# Patient Record
Sex: Female | Born: 1940 | Race: Black or African American | Hispanic: No | Smoking: Never smoker
Health system: Southern US, Community
[De-identification: ages and names within clinical notes are randomized; demographics above are authoritative.]

## PROBLEM LIST (undated history)

## (undated) DIAGNOSIS — R1310 Dysphagia, unspecified: Secondary | ICD-10-CM

## (undated) DIAGNOSIS — F0390 Unspecified dementia without behavioral disturbance: Secondary | ICD-10-CM

## (undated) DIAGNOSIS — F329 Major depressive disorder, single episode, unspecified: Secondary | ICD-10-CM

## (undated) DIAGNOSIS — K5900 Constipation, unspecified: Secondary | ICD-10-CM

## (undated) DIAGNOSIS — D649 Anemia, unspecified: Secondary | ICD-10-CM

## (undated) DIAGNOSIS — I639 Cerebral infarction, unspecified: Secondary | ICD-10-CM

## (undated) DIAGNOSIS — H409 Unspecified glaucoma: Secondary | ICD-10-CM

## (undated) DIAGNOSIS — H269 Unspecified cataract: Secondary | ICD-10-CM

## (undated) DIAGNOSIS — E119 Type 2 diabetes mellitus without complications: Secondary | ICD-10-CM

## (undated) DIAGNOSIS — I10 Essential (primary) hypertension: Secondary | ICD-10-CM

## (undated) DIAGNOSIS — E785 Hyperlipidemia, unspecified: Secondary | ICD-10-CM

## (undated) DIAGNOSIS — F32A Depression, unspecified: Secondary | ICD-10-CM

## (undated) HISTORY — DX: Type 2 diabetes mellitus without complications: E11.9

## (undated) HISTORY — DX: Anemia, unspecified: D64.9

## (undated) HISTORY — DX: Hyperlipidemia, unspecified: E78.5

## (undated) HISTORY — DX: Depression, unspecified: F32.A

## (undated) HISTORY — DX: Constipation, unspecified: K59.00

## (undated) HISTORY — DX: Essential (primary) hypertension: I10

## (undated) HISTORY — DX: Dysphagia, unspecified: R13.10

## (undated) HISTORY — DX: Unspecified glaucoma: H40.9

## (undated) HISTORY — DX: Major depressive disorder, single episode, unspecified: F32.9

## (undated) HISTORY — DX: Unspecified cataract: H26.9

## (undated) HISTORY — DX: Unspecified dementia, unspecified severity, without behavioral disturbance, psychotic disturbance, mood disturbance, and anxiety: F03.90

## (undated) HISTORY — DX: Cerebral infarction, unspecified: I63.9

---

## 2011-04-09 LAB — HM DIABETES EYE EXAM

## 2012-05-19 ENCOUNTER — Other Ambulatory Visit: Payer: Self-pay | Admitting: *Deleted

## 2012-05-19 MED ORDER — LORAZEPAM 1 MG PO TABS: ORAL_TABLET | ORAL | Status: DC

## 2012-05-27 ENCOUNTER — Ambulatory Visit: Payer: Medicare Other | Admitting: Neurology

## 2012-06-01 ENCOUNTER — Encounter: Payer: Self-pay | Admitting: Neurology

## 2012-06-01 ENCOUNTER — Ambulatory Visit (INDEPENDENT_AMBULATORY_CARE_PROVIDER_SITE_OTHER): Payer: Medicare Other | Admitting: Neurology

## 2012-06-01 VITALS — BP 168/97 | HR 74

## 2012-06-01 DIAGNOSIS — F329 Major depressive disorder, single episode, unspecified: Secondary | ICD-10-CM

## 2012-06-01 DIAGNOSIS — D649 Anemia, unspecified: Secondary | ICD-10-CM

## 2012-06-01 DIAGNOSIS — F0390 Unspecified dementia without behavioral disturbance: Secondary | ICD-10-CM | POA: Insufficient documentation

## 2012-06-01 DIAGNOSIS — I639 Cerebral infarction, unspecified: Secondary | ICD-10-CM

## 2012-06-01 DIAGNOSIS — R1310 Dysphagia, unspecified: Secondary | ICD-10-CM

## 2012-06-01 DIAGNOSIS — I6350 Cerebral infarction due to unspecified occlusion or stenosis of unspecified cerebral artery: Secondary | ICD-10-CM

## 2012-06-01 DIAGNOSIS — F3289 Other specified depressive episodes: Secondary | ICD-10-CM

## 2012-06-01 DIAGNOSIS — E119 Type 2 diabetes mellitus without complications: Secondary | ICD-10-CM

## 2012-06-01 DIAGNOSIS — E785 Hyperlipidemia, unspecified: Secondary | ICD-10-CM | POA: Insufficient documentation

## 2012-06-01 DIAGNOSIS — H269 Unspecified cataract: Secondary | ICD-10-CM

## 2012-06-01 DIAGNOSIS — H409 Unspecified glaucoma: Secondary | ICD-10-CM | POA: Insufficient documentation

## 2012-06-01 DIAGNOSIS — I10 Essential (primary) hypertension: Secondary | ICD-10-CM

## 2012-06-01 DIAGNOSIS — K5900 Constipation, unspecified: Secondary | ICD-10-CM

## 2012-06-01 NOTE — Progress Notes (Addendum)
Mrs. Artiga is a 72 years old right-handed African American female, accompanied by her daughter, referred by her primary care physician for evaluation of visual loss  She had a past medical history of stroke in 2006, was mute with spastic right hemiparesis, she used to live at home, now in nursing home for one year, at baseline, she need help feeding, dressing, wearing diaper, cannot communicate, only following simple commands, cannot talk in full sentences,  Reported couple years history of gradual onset visual loss, she reported a history of glaucoma, cataract, recent ophthalmology evaluation, was told she is not a candidate for cataract surgery, there was also increased intraocular pressure,  Her daughter wants to rule out neurological cause such as stroke for her visual loss,   Review of Systems  Out of a complete 14 system review, the patient complains of only the following symptoms, and all other reviewed systems are negative. See above HPI  Constitutional:   N/A Cardiovascular:  N/A Ear/Nose/Throat:  N/A Skin: N/A Eyes: N/A Respiratory: N/A Gastroitestinal: N/A    Hematology/Lymphatic:  N/A Endocrine:  N/A Musculoskeletal:N/A Allergy/Immunology: N/A Neurological: N/A Psychiatric:    N/A.  PHYSICAL EXAMINATOINS:  Generalized: In no acute distress sitting in wheelchair  Neck: Supple, no carotid bruits   Cardiac: Regular rate rhythm  Pulmonary: Clear to auscultation bilaterally  Musculoskeletal: No deformity  Neurological examination  Mentation: mute, following one step command,   Cranial nerve II-XII: Pupils were enlarged, very sluggish reactive, could not visualize fundi, patient is not cooperative.   Right lower face weakness.   hearing was intact to finger rubbing bilaterally. Uvula tongue midline.  head turning and shoulder shrug and were normal and symmetric.Tongue protrusion into cheek strength was normal.  Motor: spastic right hemiparesis. Right arm in fixed  elbow flexion, pronation, wrist flexion, finger flexion, right shoulder adduction, right knee extension, right ankle plantar flexion.  Sensory: not reliable.  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 her visual difficulty is likely due to intrinsic eye disease, her daugther reported a history of cataract, glaucoma, with recent elevated eye pressure, there is no evidence of new stroke, 2. Because of the inconvenience of the imaging study, we decided to hold off further evaluation at this point

## 2012-06-10 ENCOUNTER — Non-Acute Institutional Stay (SKILLED_NURSING_FACILITY): Payer: Medicare Other | Admitting: Internal Medicine

## 2012-06-10 DIAGNOSIS — I10 Essential (primary) hypertension: Secondary | ICD-10-CM

## 2012-06-10 DIAGNOSIS — F0280 Dementia in other diseases classified elsewhere without behavioral disturbance: Secondary | ICD-10-CM

## 2012-06-10 DIAGNOSIS — G309 Alzheimer's disease, unspecified: Secondary | ICD-10-CM

## 2012-06-10 DIAGNOSIS — I6990 Unspecified sequelae of unspecified cerebrovascular disease: Secondary | ICD-10-CM

## 2012-06-10 DIAGNOSIS — K5900 Constipation, unspecified: Secondary | ICD-10-CM

## 2012-06-24 DIAGNOSIS — I6990 Unspecified sequelae of unspecified cerebrovascular disease: Secondary | ICD-10-CM | POA: Insufficient documentation

## 2012-06-24 DIAGNOSIS — I10 Essential (primary) hypertension: Secondary | ICD-10-CM | POA: Insufficient documentation

## 2012-06-24 DIAGNOSIS — G309 Alzheimer's disease, unspecified: Secondary | ICD-10-CM | POA: Insufficient documentation

## 2012-06-24 DIAGNOSIS — K5900 Constipation, unspecified: Secondary | ICD-10-CM | POA: Insufficient documentation

## 2012-06-24 NOTE — Progress Notes (Signed)
Patient ID: Katie House, female   DOB: [birth year], 72 y.o.   MRN: [id number]        PROGRESS NOTE  DATE: 06/10/2012  FACILITY: Nursing Home Location: [facility]  LEVEL OF CARE: SNF (31)  Routine Visit  CHIEF COMPLAINT:  Manage dementia and hypertension.    HISTORY OF PRESENT ILLNESS:  REASSESSMENT OF ONGOING PROBLEM(S):  DEMENTIA: The dementia remains stable and continues to function adequately in the current living environment with supervision.  The patient has had little changes in behavior. No complications noted from the medications presently being used.   HTN: Pt 's HTN remains stable.  Staff denies CP, sob, DOE, pedal edema, headaches, dizziness or visual disturbances.  No complications from the medications currently being used.  Last BP : 141/82.  PAST MEDICAL HISTORY : Reviewed.  No changes.  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   PHYSICAL EXAMINATION  VS:  T 97.8       P 86      RR 18     BP 141/82   POX %     WT (Lb) 128.6  GENERAL: no acute distress, normal body habitus NECK: supple, trachea midline, no neck masses, no thyroid tenderness, no thyromegaly RESPIRATORY: breathing is even & unlabored, BS CTAB CARDIAC: RRR, no murmur,no extra heart sounds, no edema GI: abdomen soft, normal BS, no masses, no tenderness, no hepatomegaly, no splenomegaly PSYCHIATRIC: the patient is alert & oriented to person, affect & behavior appropriate  LABS/RADIOLOGY: 01/2012:  Hemoglobin A1C 6.2.    12/2011:  Hemoglobin 10.3, MCV  83.1, otherwise CBC normal.   CMP normal.   ASSESSMENT/PLAN:  Hypertension.  Well controlled.   Dementia.  Advanced.   History of CVA.  Continue supportive care.  Constipation.  Continue current laxatives.   Seizure disorder.  Stable.   Depression.  Continue Celexa.   CPT CODE: 13086

## 2012-07-14 ENCOUNTER — Non-Acute Institutional Stay (SKILLED_NURSING_FACILITY): Payer: Medicare Other | Admitting: Internal Medicine

## 2012-07-14 DIAGNOSIS — I6990 Unspecified sequelae of unspecified cerebrovascular disease: Secondary | ICD-10-CM

## 2012-07-14 DIAGNOSIS — G309 Alzheimer's disease, unspecified: Secondary | ICD-10-CM

## 2012-07-14 DIAGNOSIS — K5900 Constipation, unspecified: Secondary | ICD-10-CM

## 2012-07-14 DIAGNOSIS — I10 Essential (primary) hypertension: Secondary | ICD-10-CM

## 2012-07-14 DIAGNOSIS — F0280 Dementia in other diseases classified elsewhere without behavioral disturbance: Secondary | ICD-10-CM

## 2012-07-16 NOTE — Progress Notes (Signed)
Patient ID: Katie House, female   DOB: 10-09-1940, 72 y.o.   MRN: [id number]        PROGRESS NOTE  DATE: 07/14/2012  FACILITY: Nursing Home Location: [facility]  LEVEL OF CARE: SNF (31)  Routine Visit  CHIEF COMPLAINT:  Manage dementia and hypertension.    HISTORY OF PRESENT ILLNESS:  REASSESSMENT OF ONGOING PROBLEM(S):  DEMENTIA: The dementia remains stable and continues to function adequately in the current living environment with supervision.  The patient has had little changes in behavior. No complications noted from the medications presently being used.   HTN: Pt 's HTN remains stable.  Staff deniey CP, sob, DOE, pedal edema, headaches, dizziness or visual disturbances.  No complications from the medications currently being used.  Last BP : 143/80, 141/82.  PAST MEDICAL HISTORY : Reviewed.  No changes.  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   PHYSICAL EXAMINATION  VS:  T 98       P 73      RR 20     BP 143/80   POX %     WT (Lb) 125.6  GENERAL: no acute distress, normal body habitus NECK: supple, trachea midline, no neck masses, no thyroid tenderness, no thyromegaly RESPIRATORY: breathing is even & unlabored, BS CTAB CARDIAC: RRR, no murmur,no extra heart sounds, no edema GI: abdomen soft, normal BS, no masses, no tenderness, no hepatomegaly, no splenomegaly PSYCHIATRIC: the patient is alert & disoriented to, affect & behavior appropriate  LABS/RADIOLOGY: 01/2012:  Hemoglobin A1C 6.2.    12/2011:  Hemoglobin 10.3, MCV  83.1, otherwise CBC normal.   CMP normal.   ASSESSMENT/PLAN:  Hypertension.  Well controlled.   Dementia.  Advanced.   History of CVA.  Continue supportive care.  Constipation.  Continue current laxatives.   Seizure disorder.  Stable.   Depression.  Continue Celexa.   Check CBC and CMP.  CPT CODE: 78295

## 2012-07-21 ENCOUNTER — Non-Acute Institutional Stay (SKILLED_NURSING_FACILITY): Payer: Medicare Other | Admitting: Internal Medicine

## 2012-07-21 DIAGNOSIS — D638 Anemia in other chronic diseases classified elsewhere: Secondary | ICD-10-CM

## 2012-07-21 DIAGNOSIS — N179 Acute kidney failure, unspecified: Secondary | ICD-10-CM

## 2012-08-10 NOTE — Progress Notes (Addendum)
Patient ID: Katie House, female   DOB: 10/23/1940, 72 y.o.   MRN: [id number]        PROGRESS NOTE  DATE: 07/21/2012  FACILITY:  [name]  LEVEL OF CARE: SNF (31)  Acute Visit  CHIEF COMPLAINT:  Manage acute renal failure and anemia of chronic disease.    HISTORY OF PRESENT ILLNESS: I was requested by the staff to assess the patient regarding above problem(s):  ACUTE RENAL FAILURE:  On 07/17/2012, patient's BUN was 25, creatinine 1.3.  In 12/2011, BUN 29, creatinine 1.18.  Patient is a poor historian secondary to aphasia.  Staff do not report increasing confusion or increasing lower extremity swelling.    ANEMIA: The anemia has been stable. The staff complications from the medications currently being used.  The patient is currently not on iron.   On 07/17/2012, patient's hemoglobin was 10.3, MCV 83.7.  In 12/2011, hemoglobin 10.3.    PAST MEDICAL HISTORY : Reviewed.  No changes.  CURRENT MEDICATIONS: Reviewed per [facility]  REVIEW OF SYSTEMS:  Unobtainable due to aphasia.    PHYSICAL EXAMINATION  GENERAL: no acute distress, normal body habitus EYES: conjunctivae normal, sclerae normal, normal eye lids NECK: supple, trachea midline, no neck masses, no thyroid tenderness, no thyromegaly LYMPHATICS: no LAN in the neck, no supraclavicular LAN RESPIRATORY: breathing is even & unlabored, BS CTAB CARDIAC: RRR, no murmur,no extra heart sounds, no edema GI: abdomen soft, normal BS, no masses, no tenderness, no hepatomegaly, no splenomegaly PSYCHIATRIC: the patient is alert & oriented to person, affect & behavior appropriate  ASSESSMENT/PLAN:  Acute renal failure.  New problem.  She is currently not on renal toxic medications. Push fluids & reassess renal functions.   Anemia of chronic disease.  Hemoglobin stable.    CPT CODE: 40981

## 2012-08-11 DIAGNOSIS — D638 Anemia in other chronic diseases classified elsewhere: Secondary | ICD-10-CM | POA: Insufficient documentation

## 2012-08-26 ENCOUNTER — Encounter: Payer: Self-pay | Admitting: Adult Health

## 2012-08-26 ENCOUNTER — Non-Acute Institutional Stay (SKILLED_NURSING_FACILITY): Payer: Medicare Other | Admitting: Adult Health

## 2012-08-26 DIAGNOSIS — I6990 Unspecified sequelae of unspecified cerebrovascular disease: Secondary | ICD-10-CM

## 2012-08-26 DIAGNOSIS — F3289 Other specified depressive episodes: Secondary | ICD-10-CM

## 2012-08-26 DIAGNOSIS — I10 Essential (primary) hypertension: Secondary | ICD-10-CM

## 2012-08-26 DIAGNOSIS — F32A Depression, unspecified: Secondary | ICD-10-CM

## 2012-08-26 DIAGNOSIS — R569 Unspecified convulsions: Secondary | ICD-10-CM

## 2012-08-26 DIAGNOSIS — F329 Major depressive disorder, single episode, unspecified: Secondary | ICD-10-CM

## 2012-08-26 DIAGNOSIS — K5900 Constipation, unspecified: Secondary | ICD-10-CM

## 2012-08-26 MED ORDER — HYDROCHLOROTHIAZIDE 12.5 MG PO CAPS: 12.5000 mg | ORAL_CAPSULE | Freq: Every day | ORAL | Status: DC

## 2012-08-26 NOTE — Progress Notes (Signed)
Patient ID: Katie House, female   DOB: [birth year], 72 y.o.   MRN: [id number]  [facility]  Allergies  Allergen Reactions  . Latex      Chief Complaint  Patient presents with  . Medical Managment of Chronic Issues    HPI: She is being seen for the management of her chronic illnesses. There are no concerns being voiced by the nursing staff. Her blood pressure is elevated and will require medication adjustment.   Past Medical History  Diagnosis Date  . Dementia   . Diabetes mellitus   . High blood pressure   . Glaucoma   . Cataracts, both eyes   . Hyperlipemia   . Depression   . Constipation   . Anemia   . Stroke   . Dysphagia, unspecified(787.20)   . Dementia, unspecified, without behavioral disturbance     No past surgical history on file.  VITAL SIGNS BP 159/80  Pulse 78  Ht 5\' 4"  (1.626 m)  Wt 125 lb 12.8 oz (57.063 kg)  BMI 21.58 kg/m2   Patient's Medications  New Prescriptions   No medications on file  Previous Medications   CALCIUM & MAGNESIUM CARBONATES (MYLANTA) 311-232 MG PER TABLET    Take 1 tablet by mouth as needed for heartburn.   CITALOPRAM (CELEXA) 20 MG TABLET    Take 20 mg by mouth daily.   LEVETIRACETAM (KEPPRA) 500 MG TABLET    Take 500 mg by mouth 2 (two) times daily.    LOSARTAN (COZAAR) 100 MG TABLET    Take 100 mg by mouth daily.   POLYETHYLENE GLYCOL POWDER (MIRALAX) POWDER    Take 17 g by mouth daily.   TRAMADOL (ULTRAM) 50 MG TABLET    Take 50 mg by mouth every 6 (six) hours as needed for pain.  Modified Medications   No medications on file  Discontinued Medications   HYDROXYPROPYL METHYLCELLULOSE (ISOPTO TEARS) 2.5 % OPHTHALMIC SOLUTION    1 drop as needed.   LORAZEPAM (ATIVAN) 1 MG TABLET    Take 1 tablet prior to dental procedure    SIGNIFICANT DIAGNOSTIC EXAMS   01-02-12: wbc 7.4; hgb 10.3; hct 31.5; mcv 83.1; plt 192; glucose 97; bun 29; creat 1.18; k+ 4.6 Na++ 141; liver normal albumin 3.5 01-30-12: hgb ac 6.2 07-17-12: wbc  8.4; hgb 10.3; hct 30.8; mcv 83.7; plt 213; glucose 105; bun 25; creat 1.30; k+ 4.3 Na++ 140; liver normal albumin 3.2     Review of Systems  Unable to perform ROS    Physical Exam  Constitutional:  frail  Neck: Neck supple. No JVD present. No thyromegaly present.  Cardiovascular: Normal rate, regular rhythm and intact distal pulses.   Respiratory: Effort normal and breath sounds normal. No respiratory distress.  GI: Soft. Bowel sounds are normal. She exhibits no distension. There is no tenderness.  Musculoskeletal: She exhibits no edema.  Has right sided spastic hemiparesis present   Neurological: She is alert.  Skin: Skin is warm and dry.       ASSESSMENT/ PLAN:  High blood pressure Is elevated will continue cozaar 100 mg daily will add hctz 12.5 mg daily will have nursing check blood pressure twice daily for 2 weeks and report will check bmp in 2 weeks and will monitor her status   Seizures No reports of seizure activity; will continue keppra 500 mg twice daily and will monitor her status   Depression Is stable will continue celexa 20 mg daily and will monitor  Unspecified constipation  Is stable will continue miralax daily and will monitor   Unspecified late effects of cerebrovascular disease Is without change in status is currently not on medications; has right sided spastic hemiparesis present

## 2012-08-26 NOTE — Assessment & Plan Note (Signed)
Is stable will continue celexa 20 mg daily and will monitor

## 2012-08-26 NOTE — Assessment & Plan Note (Signed)
Is stable will continue miralax daily and will monitor

## 2012-08-26 NOTE — Assessment & Plan Note (Signed)
No reports of seizure activity; will continue keppra 500 mg twice daily and will monitor her status

## 2012-08-26 NOTE — Assessment & Plan Note (Signed)
Is elevated will continue cozaar 100 mg daily will add hctz 12.5 mg daily will have nursing check blood pressure twice daily for 2 weeks and report will check bmp in 2 weeks and will monitor her status

## 2012-08-26 NOTE — Assessment & Plan Note (Signed)
Is without change in status is currently not on medications; has right sided spastic hemiparesis present

## 2012-09-15 ENCOUNTER — Non-Acute Institutional Stay (SKILLED_NURSING_FACILITY): Payer: Medicare Other | Admitting: Internal Medicine

## 2012-09-15 DIAGNOSIS — I10 Essential (primary) hypertension: Secondary | ICD-10-CM

## 2012-09-15 DIAGNOSIS — I639 Cerebral infarction, unspecified: Secondary | ICD-10-CM

## 2012-09-15 DIAGNOSIS — F0390 Unspecified dementia without behavioral disturbance: Secondary | ICD-10-CM

## 2012-09-15 DIAGNOSIS — I6350 Cerebral infarction due to unspecified occlusion or stenosis of unspecified cerebral artery: Secondary | ICD-10-CM

## 2012-09-15 DIAGNOSIS — N179 Acute kidney failure, unspecified: Secondary | ICD-10-CM

## 2012-09-17 DIAGNOSIS — N179 Acute kidney failure, unspecified: Secondary | ICD-10-CM | POA: Insufficient documentation

## 2012-09-17 NOTE — Progress Notes (Signed)
Patient ID: Katie House, female   DOB: 10-16-1940, 71 y.o.   MRN: [id number]        PROGRESS NOTE  DATE: 09/15/2012  FACILITY: Nursing Home Location: [facility]  LEVEL OF CARE: SNF (31)  Routine Visit  CHIEF COMPLAINT:  Manage acute renal failure, dementia and hypertension.    HISTORY OF PRESENT ILLNESS:  REASSESSMENT OF ONGOING PROBLEM(S):  ARF: 18-1-14 BUN 27, creatinine 1.43, on 07/17/12. BUN 25, creatinine 1.3. Patient was started on hydrochlorothiazide. Staff do not report increasing confusion or lower extremity swelling.  DEMENTIA: The dementia remains stable and continues to function adequately in the current living environment with supervision.  The patient has had little changes in behavior. No complications noted from the medications presently being used. Patient does not follow commands.  HTN: Pt 's HTN remains stable.  Staff deny CP, sob, DOE, pedal edema, headaches, dizziness or visual disturbances.  No complications from the medications currently being used.  Last BP : 143/80, 141/82, 131/71.  PAST MEDICAL HISTORY : Reviewed.  No changes.  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   PHYSICAL EXAMINATION  VS:  T 98.5       P 71      RR 16    BP 131/71  POX %     WT (Lb) 125.8  GENERAL: no acute distress, normal body habitus EYES: Normal conjunctivae, normal sclerae, no discharge NECK: supple, trachea midline, no neck masses, no thyroid tenderness, no thyromegaly LYMPHATICS: No cervical lymphadenopathy, no supraclavicular lymphadenopathy RESPIRATORY: breathing is even & unlabored, BS CTAB CARDIAC: RRR, no murmur,no extra heart sounds, no edema GI: abdomen soft, normal BS, no masses, no tenderness, no hepatomegaly, no splenomegaly PSYCHIATRIC: the patient is alert & disoriented to, affect & behavior appropriate  LABS/RADIOLOGY:  8/14 glucose 110, BUN 27, creatinine 1.43 otherwise BMP  normal 6/14 hemoglobin 10.3, MCV 83.7 otherwise CBC normal , albumin 3.2 otherwise liver profile normal,  01/2012:  Hemoglobin A1C 6.2.    12/2011:  Hemoglobin 10.3, MCV  83.1, otherwise CBC normal.   CMP normal.   ASSESSMENT/PLAN:  Hypertension.  Well controlled.   Acute renal failure-likely secondary to hydrochlorothiazide. Recheck.  Dementia.  Advanced.   History of CVA.  Continue supportive care.  Constipation.  Continue current laxatives.   Seizure disorder.  Stable.   Depression.  Continue Celexa.   CPT CODE: 91478

## 2012-10-21 ENCOUNTER — Non-Acute Institutional Stay (SKILLED_NURSING_FACILITY): Payer: Medicare Other | Admitting: Internal Medicine

## 2012-10-21 DIAGNOSIS — F0280 Dementia in other diseases classified elsewhere without behavioral disturbance: Secondary | ICD-10-CM

## 2012-10-21 DIAGNOSIS — I10 Essential (primary) hypertension: Secondary | ICD-10-CM

## 2012-10-21 DIAGNOSIS — I6350 Cerebral infarction due to unspecified occlusion or stenosis of unspecified cerebral artery: Secondary | ICD-10-CM

## 2012-10-21 DIAGNOSIS — N179 Acute kidney failure, unspecified: Secondary | ICD-10-CM

## 2012-10-21 DIAGNOSIS — I639 Cerebral infarction, unspecified: Secondary | ICD-10-CM

## 2012-10-21 NOTE — Progress Notes (Signed)
Patient ID: Katie House, female   DOB: 02-Feb-1941, 72 y.o.   MRN: [id number]        PROGRESS NOTE  DATE: 10/21/2012  FACILITY: Nursing Home Location: [facility]  LEVEL OF CARE: SNF (31)  Routine Visit  CHIEF COMPLAINT:  Manage acute renal failure, dementia and hypertension.    HISTORY OF PRESENT ILLNESS:  REASSESSMENT OF ONGOING PROBLEM(S):  ARF: 09-11-12 BUN 27, creatinine 1.43, on 07/17/12. BUN 25, creatinine 1.3. Patient was started on hydrochlorothiazide. Staff do not report increasing confusion or lower extremity swelling.  DEMENTIA: The dementia remains stable and continues to function adequately in the current living environment with supervision.  The patient has had little changes in behavior. No complications noted from the medications presently being used. Patient does not follow commands.  HTN: Pt 's HTN remains stable.  Staff deny CP, sob, DOE, pedal edema, headaches, dizziness or visual disturbances.  No complications from the medications currently being used.  Last BP : 143/80, 141/82, 131/71, 156/73.  PAST MEDICAL HISTORY : Reviewed.  No changes.  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   PHYSICAL EXAMINATION  VS:  T 98.2      P 96      RR 20    BP 156/73  POX %     WT (Lb) 123  GENERAL: no acute distress, normal body habitus NECK: supple, trachea midline, no neck masses, no thyroid tenderness, no thyromegaly RESPIRATORY: breathing is even & unlabored, BS CTAB CARDIAC: RRR, no murmur,no extra heart sounds, no edema GI: abdomen soft, normal BS, no masses, no tenderness, no hepatomegaly, no splenomegaly PSYCHIATRIC: the patient is alert & disoriented to, affect & behavior appropriate  LABS/RADIOLOGY:  8/14 glucose 110, BUN 27, creatinine 1.43 otherwise BMP normal 6/14 hemoglobin 10.3, MCV 83.7 otherwise CBC normal , albumin 3.2 otherwise liver profile normal,  01/2012:  Hemoglobin A1C  6.2.    12/2011:  Hemoglobin 10.3, MCV  83.1, otherwise CBC normal.   CMP normal.   ASSESSMENT/PLAN:  Hypertension.  uncontrolled. Will review a log  Acute renal failure-likely secondary to hydrochlorothiazide. Recheck.  Dementia.  Advanced.   History of CVA.  Continue supportive care.  Constipation.  Continue current laxatives.   Seizure disorder.  Stable.   Depression.  Continue Celexa.   CPT CODE: 46962

## 2012-10-27 ENCOUNTER — Non-Acute Institutional Stay (SKILLED_NURSING_FACILITY): Payer: Medicare Other | Admitting: Family

## 2012-10-27 ENCOUNTER — Non-Acute Institutional Stay: Payer: Medicare Other | Admitting: Family

## 2012-10-27 ENCOUNTER — Encounter: Payer: Self-pay | Admitting: Family

## 2012-10-27 DIAGNOSIS — N179 Acute kidney failure, unspecified: Secondary | ICD-10-CM

## 2012-10-27 DIAGNOSIS — I10 Essential (primary) hypertension: Secondary | ICD-10-CM

## 2012-10-27 NOTE — Progress Notes (Signed)
Patient ID: Katie House, female   DOB: [birth year], 72 y.o.   MRN: [id number]  Date:10/27/12  Chief Complaint  Patient presents with  . Acute Visit    Follow up for abnormal labs     HPI:  Pt is being followed for medical management of chronic illnesses.  Pt is also being followed up for abnormal lab values. Pt Creatinine is 1.43.  Pt creatinine level increase has been insidious. Prior lab creatinine lab value 1.43, with otherwise normal BMET.Pt was recently started on HCTZ..  Pt and nursing staff denies constitutional s/s such as N/V/D, fever, chills, anuria, or flank pain.  Allergies  Allergen Reactions  . Latex      Medication List       This list is accurate as of: 10/27/12  9:04 PM.  Always use your most recent med list.               citalopram 20 MG tablet  Commonly known as:  CELEXA  Take 20 mg by mouth daily.     hydrochlorothiazide 12.5 MG capsule  Commonly known as:  MICROZIDE  Take 1 capsule (12.5 mg total) by mouth daily.     KEPPRA 500 MG tablet  Generic drug:  levETIRAcetam  Take 500 mg by mouth 2 (two) times daily.     losartan 100 MG tablet  Commonly known as:  COZAAR  Take 100 mg by mouth daily.     MIRALAX powder  Generic drug:  polyethylene glycol powder  Take 17 g by mouth daily.     MYLANTA 311-232 MG per tablet  Generic drug:  calcium & magnesium carbonates  Take 1 tablet by mouth as needed for heartburn.     traMADol 50 MG tablet  Commonly known as:  ULTRAM  Take 50 mg by mouth every 6 (six) hours as needed for pain.         DATA REVIEWED   Laboratory Studies: 09-11-12 BUN 27, creatinine 1.43, on 07/17/12. BUN 25, creatinine 1.3.  10-27-12 Creatinine 1/43, Calcium 9.3, Glucose 119, CO2 26, Chloride 105, Potassium 4.0, Sodium 139      Past Medical History  Diagnosis Date  . Dementia   . Diabetes mellitus   . High blood pressure   . Glaucoma   . Cataracts, both eyes   . Hyperlipemia   . Depression   . Constipation   . Anemia    . Stroke   . Dysphagia, unspecified(787.20)   . Dementia, unspecified, without behavioral disturbance    History reviewed. No pertinent past surgical history.  History   Social History  . Marital Status: Divorced    Spouse Name: N/A    Number of Children: N/A  . Years of Education: N/A   Occupational History  . Not on file.   Social History Main Topics  . Smoking status: Never Smoker   . Smokeless tobacco: Not on file  . Alcohol Use: No  . Drug Use: No  . Sexual Activity: Not on file   Other Topics Concern  . Not on file   Social History Narrative  . No narrative on file     Review of Systems  Pt unable to fully participate in the ROS  Physical Exam Blood pressure 117/68, pulse 70, temperature 97.1 F (36.2 C), resp. rate 20.  ASSESSMENT/PLAN  Acute Renal Failure-Encourage fluids, recheck BMET in one week. Hypertension-Blood pressure remains stable; will continue current regimen   Follow up: In one week

## 2012-10-27 NOTE — Progress Notes (Signed)
Patient ID: Katie House, female   DOB: 1940/05/09, 72 y.o.   MRN: [id number] Patient ID: Katie House, female   DOB: 1940-12-14, 72 y.o.   MRN: [id number]  Date:10/27/12  Chief Complaint  Patient presents with  . Acute Visit    Follow up for abnormal labs     HPI:  Pt is being followed for medical management of chronic illnesses.  Pt is also being followed up for abnormal lab values. Pt Creatinine is 1.43.  Pt creatinine level increase has been insidious. Prior lab creatinine lab value 1.43, with otherwise normal BMET.Pt was recently started on HCTZ..  Pt and nursing staff denies constitutional s/s such as N/V/D, fever, chills, anuria, or flank pain.  Allergies  Allergen Reactions  . Latex      Medication List       This list is accurate as of: 10/27/12  9:04 PM.  Always use your most recent med list.               citalopram 20 MG tablet  Commonly known as:  CELEXA  Take 20 mg by mouth daily.     hydrochlorothiazide 12.5 MG capsule  Commonly known as:  MICROZIDE  Take 1 capsule (12.5 mg total) by mouth daily.     KEPPRA 500 MG tablet  Generic drug:  levETIRAcetam  Take 500 mg by mouth 2 (two) times daily.     losartan 100 MG tablet  Commonly known as:  COZAAR  Take 100 mg by mouth daily.     MIRALAX powder  Generic drug:  polyethylene glycol powder  Take 17 g by mouth daily.     MYLANTA 311-232 MG per tablet  Generic drug:  calcium & magnesium carbonates  Take 1 tablet by mouth as needed for heartburn.     traMADol 50 MG tablet  Commonly known as:  ULTRAM  Take 50 mg by mouth every 6 (six) hours as needed for pain.         DATA REVIEWED   Laboratory Studies: 09-11-12 BUN 27, creatinine 1.43, on 07/17/12. BUN 25, creatinine 1.3.  10-27-12 Creatinine 1/43, Calcium 9.3, Glucose 119, CO2 26, Chloride 105, Potassium 4.0, Sodium 139      Past Medical History  Diagnosis Date  . Dementia   . Diabetes mellitus   . High blood pressure   . Glaucoma   . Cataracts,  both eyes   . Hyperlipemia   . Depression   . Constipation   . Anemia   . Stroke   . Dysphagia, unspecified(787.20)   . Dementia, unspecified, without behavioral disturbance    History reviewed. No pertinent past surgical history.  History   Social History  . Marital Status: Divorced    Spouse Name: N/A    Number of Children: N/A  . Years of Education: N/A   Occupational History  . Not on file.   Social History Main Topics  . Smoking status: Never Smoker   . Smokeless tobacco: Not on file  . Alcohol Use: No  . Drug Use: No  . Sexual Activity: Not on file   Other Topics Concern  . Not on file   Social History Narrative  . No narrative on file     Review of Systems  Pt unable to fully participate in the ROS  Physical Exam Blood pressure 117/68, pulse 70, temperature 97.1 F (36.2 C), resp. rate 20. Physical Exam  Constitutional: No distress.  HENT:  Head: Normocephalic.  Mouth/Throat: Oropharynx is clear  and moist.  Cardiovascular: Normal rate, regular rhythm and normal heart sounds.   No murmur heard. Pulmonary/Chest: Effort normal and breath sounds normal. No respiratory distress.  Abdominal: Soft. Bowel sounds are normal.  Lymphadenopathy:    She has no cervical adenopathy.  Neurological: She is alert.  Skin: Skin is warm and dry.     ASSESSMENT/PLAN  Acute Renal Failure-Encourage fluids, recheck BMET in one week. Hypertension-Blood pressure remains stable; will continue current regimen   Follow up: In one week

## 2012-11-03 ENCOUNTER — Non-Acute Institutional Stay (SKILLED_NURSING_FACILITY): Payer: Medicare Other | Admitting: Internal Medicine

## 2012-11-03 DIAGNOSIS — N289 Disorder of kidney and ureter, unspecified: Secondary | ICD-10-CM | POA: Insufficient documentation

## 2012-11-03 NOTE — Progress Notes (Signed)
PROGRESS NOTE  DATE: 11/03/2012  FACILITY:  [name]  LEVEL OF CARE: SNF (31)  Acute Visit  CHIEF COMPLAINT:  Manage renal insufficiency  HISTORY OF PRESENT ILLNESS: I was requested by the staff to assess the patient regarding above problem(s):  CHRONIC KIDNEY DISEASE: The patient's chronic kidney disease remains stable.  staff deny increasing lower extremity swelling or confusion. Last BUN and creatinine are: 23/1.43 in 10-23-12. In 8-14 creatinine 1.43.  PAST MEDICAL HISTORY : Reviewed.  No changes.  CURRENT MEDICATIONS: Reviewed per [facility]  PHYSICAL EXAMINATION  GENERAL: no acute distress, normal body habitus RESPIRATORY: breathing is even & unlabored, BS CTAB CARDIAC: RRR, no murmur,no extra heart sounds, no edema  LABS/RADIOLOGY: See history of present illness  ASSESSMENT/PLAN:  Renal insufficiency-renal functions stable.  CPT CODE: 13086

## 2012-11-09 ENCOUNTER — Other Ambulatory Visit: Payer: Self-pay

## 2012-11-09 MED ORDER — LORAZEPAM 1 MG PO TABS: ORAL_TABLET | ORAL | Status: DC

## 2012-11-09 NOTE — Telephone Encounter (Signed)
Verified dose and instructions reflect manual request received by nursing home.   

## 2012-11-17 ENCOUNTER — Non-Acute Institutional Stay (SKILLED_NURSING_FACILITY): Payer: Medicare Other | Admitting: Internal Medicine

## 2012-11-17 DIAGNOSIS — I6990 Unspecified sequelae of unspecified cerebrovascular disease: Secondary | ICD-10-CM

## 2012-11-17 DIAGNOSIS — N189 Chronic kidney disease, unspecified: Secondary | ICD-10-CM

## 2012-11-17 DIAGNOSIS — I150 Renovascular hypertension: Secondary | ICD-10-CM

## 2012-11-17 DIAGNOSIS — F0280 Dementia in other diseases classified elsewhere without behavioral disturbance: Secondary | ICD-10-CM

## 2012-11-27 DIAGNOSIS — N189 Chronic kidney disease, unspecified: Secondary | ICD-10-CM | POA: Insufficient documentation

## 2012-11-27 DIAGNOSIS — I150 Renovascular hypertension: Secondary | ICD-10-CM | POA: Insufficient documentation

## 2012-11-27 NOTE — Progress Notes (Signed)
Patient ID: Katie House, female   DOB: 07/17/1940, 72 y.o.   MRN: [id number]        PROGRESS NOTE  DATE: 11/17/2012  FACILITY: Nursing Home Location: [facility]  LEVEL OF CARE: SNF (31)  Routine Visit  CHIEF COMPLAINT:  Manage CKD, dementia and hypertension.    HISTORY OF PRESENT ILLNESS:  REASSESSMENT OF ONGOING PROBLEM(S):  CHRONIC KIDNEY DISEASE: The patient's chronic kidney disease remains stable.  staff deny increasing lower extremity swelling or confusion. Last BUN and creatinine are: 26/1.45 on 11-05-12.  In 8/14 27/1.45, In 6-14  25/1.3.   DEMENTIA: The dementia remains stable and continues to function adequately in the current living environment with supervision.  The patient has had little changes in behavior. No complications noted from the medications presently being used. Patient does not follow commands.  HTN: Pt 's HTN remains stable.  Staff deny CP, sob, DOE, pedal edema, headaches, dizziness or visual disturbances.  No complications from the medications currently being used.  Last BP : 143/80, 141/82, 131/71, 156/73.  PAST MEDICAL HISTORY : Reviewed.  No changes.  CURRENT MEDICATIONS: Reviewed per [facility]  REVIEW OF SYSTEMS:  Unobtainable due to global aphasia.   PHYSICAL EXAMINATION  VS:  T 98     P 43     RR 18    BP 123/68  POX %     WT (Lb) 123  GENERAL: no acute distress, normal body habitus EYES: unable to assess NECK: supple, trachea midline, no neck masses, no thyroid tenderness, no thyromegaly LYMPHATICS: no cervical LAN, no supraclavicular LAN RESPIRATORY: breathing is even & unlabored, BS CTAB CARDIAC: RRR, no murmur,no extra heart sounds, no edema GI: abdomen soft, normal BS, no masses, no tenderness, no hepatomegaly, no splenomegaly PSYCHIATRIC: the patient is alert & disoriented to, affect & behavior appropriate  LABS/RADIOLOGY:  9-14 glc 115, bun 26, cr 1.45 ow bmp nl  8/14 glucose 110, BUN 27, creatinine 1.43 otherwise  BMP normal 6/14 hemoglobin 10.3, MCV 83.7 otherwise CBC normal , albumin 3.2 otherwise liver profile normal,  01/2012:  Hemoglobin A1C 6.2.    12/2011:  Hemoglobin 10.3, MCV  83.1, otherwise CBC normal.   CMP normal.   ASSESSMENT/PLAN:  Hypertension.  Well controlled.  CKD-stable.  Dementia.  Advanced.   History of CVA.  Continue supportive care.  Constipation.  Continue current laxatives.   Seizure disorder.  Stable.   Depression.  Continue Celexa.   CPT CODE: 40981

## 2013-01-18 ENCOUNTER — Non-Acute Institutional Stay (SKILLED_NURSING_FACILITY): Payer: Medicare Other | Admitting: Internal Medicine

## 2013-01-18 DIAGNOSIS — L84 Corns and callosities: Secondary | ICD-10-CM

## 2013-03-01 ENCOUNTER — Non-Acute Institutional Stay (SKILLED_NURSING_FACILITY): Payer: Medicare Other | Admitting: Internal Medicine

## 2013-03-01 DIAGNOSIS — F0280 Dementia in other diseases classified elsewhere without behavioral disturbance: Secondary | ICD-10-CM

## 2013-03-01 DIAGNOSIS — I6990 Unspecified sequelae of unspecified cerebrovascular disease: Secondary | ICD-10-CM

## 2013-03-01 DIAGNOSIS — G309 Alzheimer's disease, unspecified: Secondary | ICD-10-CM

## 2013-03-01 DIAGNOSIS — N189 Chronic kidney disease, unspecified: Secondary | ICD-10-CM

## 2013-03-01 DIAGNOSIS — I150 Renovascular hypertension: Secondary | ICD-10-CM

## 2013-03-05 NOTE — Progress Notes (Signed)
Patient ID: Katie House, female   DOB: 12/05/1940, 73 y.o.   MRN: [id number]         PROGRESS NOTE  DATE: 03/01/2013  FACILITY: Nursing Home Location: [facility]  LEVEL OF CARE: SNF (31)  Routine Visit  CHIEF COMPLAINT:  Manage CKD, dementia and hypertension.    HISTORY OF PRESENT ILLNESS:  REASSESSMENT OF ONGOING PROBLEM(S):  CHRONIC KIDNEY DISEASE: The patient's chronic kidney disease remains stable.  staff deny increasing lower extremity swelling or confusion. Last BUN and creatinine are: 26/1.45 on 11-05-12.  In 8/14 27/1.45, In 6-14  25/1.3, in 9-14 BUN 26, creatinine 1.45.  DEMENTIA: The dementia remains stable and continues to function adequately in the current living environment with supervision.  The patient has had little changes in behavior. No complications noted from the medications presently being used. Patient does not follow commands.  HTN: Pt 's HTN remains stable.  Staff deny CP, sob, DOE, pedal edema, headaches, dizziness or visual disturbances.  No complications from the medications currently being used.  Last BP : 143/80, 141/82, 131/71, 156/73, 130/70.  PAST MEDICAL HISTORY : Reviewed.  No changes.  [facility]  REVIEW OF SYSTEMS:  Unobtainable due to global aphasia.   PHYSICAL EXAMINATION  VS:  T 97     P 64     RR 18    BP 130/70  POX %     WT (Lb) 121  GENERAL: no acute distress, normal body habitus SKIN: Dry skin on feet EYES: unable to assess NECK: supple, trachea midline, no neck masses, no thyroid tenderness, no thyromegaly LYMPHATICS: no cervical LAN, no supraclavicular LAN RESPIRATORY: breathing is even & unlabored, BS CTAB CARDIAC: RRR, no murmur,no extra heart sounds, no edema GI: abdomen soft, normal BS, no masses, no tenderness, no hepatomegaly, no splenomegaly PSYCHIATRIC: the patient is alert & disoriented to, affect & behavior appropriate  LABS/RADIOLOGY:  9-14 glc 115, bun 26, cr 1.45  ow bmp nl  8/14 glucose 110, BUN 27, creatinine 1.43 otherwise BMP normal 6/14 hemoglobin 10.3, MCV 83.7 otherwise CBC normal , albumin 3.2 otherwise liver profile normal,  01/2012:  Hemoglobin A1C 6.2.    12/2011:  Hemoglobin 10.3, MCV  83.1, otherwise CBC normal.   CMP normal.   ASSESSMENT/PLAN:  Hypertension.  Well controlled.  CKD-stable.  Dementia.  Advanced.   History of CVA.  Continue supportive care.  Constipation.  Continue current laxatives.   Seizure disorder.  Stable.   Depression.  Continue Celexa.   Calluses in feet-request podiatry consultation  Check CBC and liver profile  CPT CODE: [id number]

## 2013-03-23 ENCOUNTER — Encounter: Payer: Self-pay | Admitting: Internal Medicine

## 2013-03-23 DIAGNOSIS — L84 Corns and callosities: Secondary | ICD-10-CM | POA: Insufficient documentation

## 2013-03-23 NOTE — Progress Notes (Signed)
Patient ID: Katie House, female   DOB: 12/12/1940, 73 y.o.   MRN: [id number]          PROGRESS NOTE  DATE: 01/18/2013    [facility]  LEVEL OF CARE: SNF (31)  Acute Visit  CHIEF COMPLAINT:  Manage left heel calluses.      HISTORY OF PRESENT ILLNESS: I was requested by the staff to assess the patient regarding above problem(s):  Staff report that patient has callused skin on left heel.  Patient does not follow commands.     PAST MEDICAL HISTORY : Reviewed.  No changes.  [facility]  PHYSICAL EXAMINATION  GENERAL: no acute distress, normal body habitus PSYCHIATRIC: the patient is alert & oriented to person, affect & behavior appropriate  SKIN:  INSPECTION:  Left heel has thick, dry skin with calluses.  No sign of infection.    ASSESSMENT/PLAN:  Left heel calluses.  No sign of infection.  We will refer to Podiatry.     THN Metrics:   BP:  140/74.  Not on aspirin.  No tobacco.    CPT CODE: [id number]

## 2013-06-10 ENCOUNTER — Non-Acute Institutional Stay (SKILLED_NURSING_FACILITY): Payer: Medicare Other | Admitting: Internal Medicine

## 2013-06-10 DIAGNOSIS — F0280 Dementia in other diseases classified elsewhere without behavioral disturbance: Secondary | ICD-10-CM

## 2013-06-10 DIAGNOSIS — N189 Chronic kidney disease, unspecified: Secondary | ICD-10-CM

## 2013-06-10 DIAGNOSIS — G309 Alzheimer's disease, unspecified: Secondary | ICD-10-CM

## 2013-06-10 DIAGNOSIS — I6990 Unspecified sequelae of unspecified cerebrovascular disease: Secondary | ICD-10-CM

## 2013-06-10 DIAGNOSIS — I150 Renovascular hypertension: Secondary | ICD-10-CM

## 2013-06-11 NOTE — Progress Notes (Signed)
Patient ID: Katie BravoJuanita Cassidy, female   DOB: 09/18/1940, 73 y.o.   MRN: [id number]          PROGRESS NOTE  DATE: 06/10/2013  FACILITY: Nursing Home Location: [facility]  LEVEL OF CARE: SNF (31)  Routine Visit  CHIEF COMPLAINT:  Manage CKD, dementia and hypertension.    HISTORY OF PRESENT ILLNESS:  REASSESSMENT OF ONGOING PROBLEM(S):  CHRONIC KIDNEY DISEASE: The patient's chronic kidney disease remains stable.  staff deny increasing lower extremity swelling or confusion. Last BUN and creatinine are: 26/1.45 on 11-05-12.  In 8/14 27/1.45, In 6-14  25/1.3, in 9-14 BUN 26, creatinine 1.45.  DEMENTIA: The dementia remains stable and continues to function adequately in the current living environment with supervision.  The patient has had little changes in behavior. No complications noted from the medications presently being used. Patient does not follow commands.  HTN: Pt 's HTN remains stable.  Staff deny CP, sob, DOE, pedal edema, headaches, dizziness or visual disturbances.  No complications from the medications currently being used.  Last BP : 143/80, 141/82, 131/71, 156/73, 130/70, 146/83.  PAST MEDICAL HISTORY : Reviewed.  No changes.  [facility]  REVIEW OF SYSTEMS:  Unobtainable due to global aphasia.   PHYSICAL EXAMINATION  VS:  See vital signs section  GENERAL: no acute distress, normal body habitus EYES: unable to assess NECK: supple, trachea midline, no neck masses, no thyroid tenderness, no thyromegaly LYMPHATICS: no cervical LAN, no supraclavicular LAN RESPIRATORY: breathing is even & unlabored, BS CTAB CARDIAC: RRR, no murmur,no extra heart sounds, no edema GI: abdomen soft, normal BS, no masses, no tenderness, no hepatomegaly, no splenomegaly PSYCHIATRIC: the patient is alert & disoriented to, affect & behavior appropriate  LABS/RADIOLOGY: 1-15 hemoglobin 10.7, MCV 91 otherwise CBC normal, albumin 3.3 otherwise liver  profile normal 9-14 glc 115, bun 26, cr 1.45 ow bmp nl  8/14 glucose 110, BUN 27, creatinine 1.43 otherwise BMP normal 6/14 hemoglobin 10.3, MCV 83.7 otherwise CBC normal , albumin 3.2 otherwise liver profile normal,  01/2012:  Hemoglobin A1C 6.2.    12/2011:  Hemoglobin 10.3, MCV  83.1, otherwise CBC normal.   CMP normal.   ASSESSMENT/PLAN:  Hypertension. Blood pressure borderline.  CKD-recheck renal functions  Dementia.  Advanced.   History of CVA.  Continue supportive care.  Constipation.  Continue current laxatives.   Seizure disorder.  Stable.   Depression.  Continue Celexa.   CPT CODE: [id number]  Newton PiggGayani Y. Kerry Doryasanayaka, MD [facility] [phone]

## 2013-06-28 ENCOUNTER — Other Ambulatory Visit: Payer: Self-pay | Admitting: *Deleted

## 2013-06-28 MED ORDER — TRAMADOL HCL 50 MG PO TABS: ORAL_TABLET | ORAL | Status: DC

## 2013-06-28 NOTE — Telephone Encounter (Signed)
[facility] [place] @ [phone].

## 2013-06-30 ENCOUNTER — Encounter: Payer: Self-pay | Admitting: Internal Medicine

## 2013-06-30 ENCOUNTER — Non-Acute Institutional Stay (SKILLED_NURSING_FACILITY): Payer: Medicare Other | Admitting: Internal Medicine

## 2013-06-30 DIAGNOSIS — I639 Cerebral infarction, unspecified: Secondary | ICD-10-CM

## 2013-06-30 DIAGNOSIS — F0390 Unspecified dementia without behavioral disturbance: Secondary | ICD-10-CM

## 2013-06-30 DIAGNOSIS — N189 Chronic kidney disease, unspecified: Secondary | ICD-10-CM

## 2013-06-30 DIAGNOSIS — D649 Anemia, unspecified: Secondary | ICD-10-CM

## 2013-06-30 DIAGNOSIS — I10 Essential (primary) hypertension: Secondary | ICD-10-CM

## 2013-06-30 DIAGNOSIS — I6350 Cerebral infarction due to unspecified occlusion or stenosis of unspecified cerebral artery: Secondary | ICD-10-CM

## 2013-06-30 NOTE — Progress Notes (Signed)
Patient ID: Katie BravoJuanita House, female   DOB: 04/20/1940, 73 y.o.   MRN: [id number]   This is a routine visit.  Level of care skilled.  Facility AF.  Chief complaint-medical management of chronic issues including dementia-history CVA-seizure disorder-hypertension-chronic kidney disease.  History of present illness.  Patient is a 73 year old female with the above diagnoses- she has a history of severe dementia with history of CVA with right hemiparesis global aphasia visual deficits and dysphagia.  Per nursing staff she continues to be quite stable and there have not been any recent acute issues.  Recent blood pressures 137/82-147/71 I took it manually today and got 136/74 she is on Losartan  \ He does have a history of renal insufficiency recent creatinine was 1.4 with BUN of 21 this appears to be relatively baseline.  Per nursing staff she does need a drink fairly well.  The macrobiosis appears to be unchanged she appears to be functioning relatively well in the setting.  She does have a history of seizure disorder she is on Keppra therapy no recent seizures to my knowledge.  Family medical social history has been reviewed will frequently progress note on 03/01/2048 in 06/10/2013 as well as history and physical on 01/29/2011.  Medications have been reviewed per MAR.  Review of systems essentially unobtainable secondary to patient's aphasic status please see history of present illness nursing staff has not noted any increased complaints of pain shortness of breath apparently she eats fairly well.  Physical exam.  Temperature is 98.9 pulse 79 respirations 16 blood pressure taken manually 136/74.  In general this is a frail elderly female in no distress resting comfortably in bed.  Her skin is warm and dry she does appear to have a mouth droop.  Oropharynx was difficult to assess patient did not really open her mouth very wide.  Eyes she does appear to have bilateral cataracts but  limited vision is apparently his baseline sclera and conjunctiva are clear.  Chest is clear to auscultation with poor respiratory effort no labored breathing.  Heart is regular rate and rhythm with occasional irregular beats there is no lower extremity edema.  Abdomen is soft nontender with positive bowel sounds.  Muscle skeletal does have right-sided weakness with contracture of her right upper extremity wrist and hand this is baseline.  Neurologic again right-sided deficits as noted above she is a phasic.  Labs.  May 18th 2015.  Sodium 140 potassium 4.5 BUN 21 creatinine 1.4 this appears to be relatively baseline.  Jan- 22nd 2015.  WBC 7.4 hemoglobin 10.7 platelets 182.  Liver function tests within normal limits except albumin of 3.3  Assessment and plan.  #1-dementia this appears to be quite advanced apparently however she is quite stable continue to monitor currently on no medication.  #2 hypertension this appears relatively well controlled she is on losartan.  #3 history seizure disorder she continues on Keppra this has been stable.  #4 history of CVA continue supportive care.  #5-it appears she has some anemia we'll update a CBC next week.  #6 renal insufficiency baseline creatinine appears to run in the low midwives we'll update this in the couple weeks to ensure stability as well as liver function tests for updated values.  Per chart review I see some listed history of diabetes-she's not on any medication Will check a hemoglobin A1c.  [id number].

## 2013-07-28 ENCOUNTER — Other Ambulatory Visit: Payer: Self-pay | Admitting: *Deleted

## 2013-07-28 MED ORDER — TRAMADOL HCL 50 MG PO TABS: ORAL_TABLET | ORAL | Status: DC

## 2013-07-28 NOTE — Telephone Encounter (Signed)
[facility] 

## 2013-08-16 ENCOUNTER — Encounter: Payer: Self-pay | Admitting: Internal Medicine

## 2013-08-16 ENCOUNTER — Non-Acute Institutional Stay: Payer: Medicare Other | Admitting: Internal Medicine

## 2013-08-16 DIAGNOSIS — D638 Anemia in other chronic diseases classified elsewhere: Secondary | ICD-10-CM

## 2013-08-16 DIAGNOSIS — F0390 Unspecified dementia without behavioral disturbance: Secondary | ICD-10-CM

## 2013-08-16 DIAGNOSIS — R109 Unspecified abdominal pain: Secondary | ICD-10-CM

## 2013-08-16 DIAGNOSIS — B999 Unspecified infectious disease: Secondary | ICD-10-CM

## 2013-08-16 DIAGNOSIS — I639 Cerebral infarction, unspecified: Secondary | ICD-10-CM

## 2013-08-16 DIAGNOSIS — N189 Chronic kidney disease, unspecified: Secondary | ICD-10-CM

## 2013-08-16 DIAGNOSIS — R569 Unspecified convulsions: Secondary | ICD-10-CM

## 2013-08-16 DIAGNOSIS — I10 Essential (primary) hypertension: Secondary | ICD-10-CM

## 2013-08-16 NOTE — Progress Notes (Signed)
Patient ID: Katie House, female   DOB: 02/29/1940, 73 y.o.   MRN: [id number]   This is a routine visit.  Level of care skilled.  Facility AF.   Chief complaint-medical management of chronic issues including dementia-history CVA-seizure disorder-hypertension-chronic kidney disease.   History of present illness.  Patient is a 73 year old female with the above diagnoses- she has a history of severe dementia with history of CVA with right hemiparesis global aphasia visual deficits and dysphagia.  Per nursing staff she continues to be quite stable and there have not been any recent acute issues.  Recent blood pressure  she is on Losartan 136/71 this appears relatively baseline with previous values \  does have a history of renal insufficiency recent creatinine was  this appears to be 1.5 with a BUN of 25 this appears to be relatively baseline   .  She does have a history of seizure disorder she is on Keppra therapy no recent seizures to my knowledge Apparently her responsible party yesterday was concerned that she may have some infection according nursing staff she does occasionally have wheezing after drinking liquids but this is short-term-. Her vital signs were stable o2 saturation was 96% on room air    Family medical social history has been reviewed will frequently progress note on 03/01/2048 in 06/10/2013 as well as history and physical on 01/29/2011.   Medications have been reviewed per MAR.   Review of systems essentially unobtainable secondary to patient's aphasic status please see history of present illness nursing staff has not noted any increased complaints of pain shortness of breath but apparently wheezing is noted at times .  Physical exam.  Temperature 97.0 axillary pulse 63 respirations 20 blood pressure 136/71 O2 saturation 96% on room air .  In general this is a frail elderly female in no distress resting comfortably in bed.  Her skin is warm and dry she does appear to have a  mouth droop.  Oropharynx was difficult to assess patient did not really open her mouth very wide.  Eyes she does appear to have bilateral cataracts but limited vision is apparently his baseline sclera and conjunctiva are clear.  Chest is clear to auscultation with poor respiratory effort no labored breathing.  Heart is regular rate and rhythm with occasional irregular beats there is no lower extremity edema.  Abdomen is soft nontender with positive bowel sounds.  Muscle skeletal does have right-sided weakness with contracture of her right upper extremity wrist and hand this is baseline.  Neurologic again right-sided deficits as noted above she is a phasic.   Labs 07/27/2013.  Sodium 142 potassium 4.6 BUN 25 creatinine 1.5.  07/08/2013.  WBC 7.7 hemoglobin 11.1 platelets 182.  Hemoglobin A1c 6.1.  Liver function tests within normal limits except albumin of 3.4.  May 18th 2015.  Sodium 140 potassium 4.5 BUN 21 creatinine 1.4 this appears to be relatively baseline.  Jan- 22nd 2015.  WBC 7.4 hemoglobin 10.7 platelets 182.  Liver function tests within normal limits except albumin of 3.3   Assessment and plan.  #1-dementia this appears to be quite advanced apparently however she is quite stable continue to monitor currently on no medication.  #2 hypertension this appears relatively well controlled she is on losartan.  #3 history seizure disorder she continues on Keppra this has been stable.  #4 history of CVA continue supportive care.  #5-history of anemia this appears relatively stable we'll update CBC.  #6 renal insufficiency baseline creatinine appears to run in the  low mid ones we'll update this #7-question infection-patient appears to be stable however apparently there is some wheezing at times will order chest x-ray also will update lab work including a CBC with differential and metabolic panel-also will obtain a urinalysis and culture again patient cannot really give any review of  systems but would like to rule out most common etiologies Also we'll monitor vital signs pulse ox every shift for 72 hours  Addendum.  Patient's daughter her responsible party was later in the facility and I did speak with her I also reevaluate patient-- daughter stated she thought her mom was having some increased abdominal discomfort and was more fidgety the past few days--abdominal exam was fairly benign although she did grimace with deep palpation-will order an abdominal x-ray as well-if this persists would consider possibly an abdominal ultrasound as well but will start with the x-ray and this was discussed with her daughter at bedside  Again we will also update liver function tests as well as an amylase and lipase  [id number]-of note greater than 35 minutes spent assessing patient-reassessing patient- discussing with nursing about her status as well with her daughter at bedside--of note greater than 50% of time spent coordinating plan of care with family input    . .Marland Kitchen

## 2013-08-19 ENCOUNTER — Encounter: Payer: Self-pay | Admitting: Internal Medicine

## 2013-08-19 ENCOUNTER — Non-Acute Institutional Stay (SKILLED_NURSING_FACILITY): Payer: Medicare Other | Admitting: Internal Medicine

## 2013-08-19 DIAGNOSIS — R109 Unspecified abdominal pain: Secondary | ICD-10-CM

## 2013-08-19 NOTE — Progress Notes (Signed)
Patient ID: Katie House, female   DOB: 10/26/1940, 73 y.o.   MRN: [id number]   This is in acute visit.  Level of care skilled.  Facility AF  Chief complaint-acute visit secondary to abdominal discomfort.  History of present illness.  Patient is in elderly resident with a history of CVA and dementia-with resultant right-sided hemi paralysis I saw her earlier this week and I actually spoke to her daughter her daughter thought she was may be having some abdominal discomfort and fullness of her upper abdomen--we did order an abdominal as well as chest x-ray which did not show any acute changes.  Also her lab work including liver function tests which were fairly unremarkable ALT was mildly elevated at 50-amylase was 85 lipase is pending.  She continues to be stable apparently is eating and drinking fairly well according to nursing.  Family medical social history as been reviewed her previous progress note on 08/16/2013.  Medications have been reviewed per MAR.  Review of systems unobtainable secondary to patient's severe dementia and relatively aphasic status-  Physical exam.  Temperature is 97.4 pulse 71 respirations 16 blood pressure 125/66.  In general this is a frail elderly female who does not appear to be in any distress resting comfortably in bed.  Skin is warm and dry.  Chest is clear to auscultation with poor respiratory effort.  Heart is regular rate and rhythm without murmur gallop or rub.  Abdomen is somewhat obese soft does not appear to be acutely tender to palpation continues to have some tenderness at times to deep palpation-could not really appreciate any organomegaly or distinct firm area.  Neurologic continues with right-sided hemiparalysis.  Labs.  08/18/2013.  WBC 8.8 hemoglobin 12.2 platelets 203.  Sodium 141 potassium 4.3 BUN 25 creatinine 1.27.  Liver function tests within normal limits except ALT  Slightly elevated at  50.  Amylase of 85 lipase is  pending.  Assessment and plan.  #1-abdominal discomfort-so far diagnostic studies have been relatively unremarkable-will order an abdominal ultrasound for followup here-this was discussed with her daughter at bedside.  Clinically she does not appear to be unstable.  [id number].

## 2013-08-22 ENCOUNTER — Encounter: Payer: Self-pay | Admitting: Internal Medicine

## 2013-08-22 DIAGNOSIS — I10 Essential (primary) hypertension: Secondary | ICD-10-CM

## 2013-08-22 DIAGNOSIS — I499 Cardiac arrhythmia, unspecified: Secondary | ICD-10-CM

## 2013-08-22 DIAGNOSIS — N10 Acute tubulo-interstitial nephritis: Secondary | ICD-10-CM

## 2013-08-23 ENCOUNTER — Encounter: Payer: Self-pay | Admitting: Internal Medicine

## 2013-08-26 ENCOUNTER — Non-Acute Institutional Stay: Payer: Medicare Other | Admitting: Internal Medicine

## 2013-08-26 ENCOUNTER — Non-Acute Institutional Stay (SKILLED_NURSING_FACILITY): Payer: Medicare Other | Admitting: Internal Medicine

## 2013-08-26 DIAGNOSIS — R109 Unspecified abdominal pain: Secondary | ICD-10-CM

## 2013-08-26 DIAGNOSIS — I499 Cardiac arrhythmia, unspecified: Secondary | ICD-10-CM

## 2013-08-26 DIAGNOSIS — N10 Acute tubulo-interstitial nephritis: Secondary | ICD-10-CM

## 2013-08-26 DIAGNOSIS — I150 Renovascular hypertension: Secondary | ICD-10-CM

## 2013-08-27 ENCOUNTER — Encounter: Payer: Self-pay | Admitting: Internal Medicine

## 2013-08-27 ENCOUNTER — Non-Acute Institutional Stay (SKILLED_NURSING_FACILITY): Payer: Medicare Other | Admitting: Internal Medicine

## 2013-08-27 DIAGNOSIS — Z982 Presence of cerebrospinal fluid drainage device: Secondary | ICD-10-CM

## 2013-08-27 DIAGNOSIS — N3000 Acute cystitis without hematuria: Secondary | ICD-10-CM

## 2013-08-27 DIAGNOSIS — I150 Renovascular hypertension: Secondary | ICD-10-CM

## 2013-08-27 DIAGNOSIS — R569 Unspecified convulsions: Secondary | ICD-10-CM

## 2013-08-27 NOTE — Progress Notes (Signed)
MRN: [id number] Name: Katie House  Sex: female Age: 73 y.o. DOB: 08-26-1940  [facility] #: [name] Facility/Room: Level Of Care: SNF Provider: Merrilee Seashore D Emergency Contacts: Extended Emergency Contact Information Primary Emergency Contact: Jeanann Lewandowsky [place] Mobile Phone: [phone] Relation: Daughter  Code Status:FULL   Allergies: Latex  Chief Complaint  Patient presents with  . Acute Visit    HPI: Patient is 73 y.o. female who was found in room looking like she was seizing with described total body shaking. BP was 202/140 at that time.  Past Medical History  Diagnosis Date  . Dementia   . Diabetes mellitus   . High blood pressure   . Glaucoma   . Cataracts, both eyes   . Hyperlipemia   . Depression   . Constipation   . Anemia   . Stroke   . Dysphagia, unspecified(787.20)   . Dementia, unspecified, without behavioral disturbance     History reviewed. No pertinent past surgical history.    Medication List       This list is accurate as of: 08/27/13 11:59 PM.  Always use your most recent med list.               citalopram 20 MG tablet  Commonly known as:  CELEXA  Take 20 mg by mouth daily.     hydrochlorothiazide 25 MG tablet  Commonly known as:  HYDRODIURIL  Take 25 mg by mouth daily.     KEPPRA 500 MG tablet  Generic drug:  levETIRAcetam  Take 500 mg by mouth 2 (two) times daily. For seizure disorder     LORazepam 1 MG tablet  Commonly known as:  ATIVAN  Take 1 mg by mouth. 1 tablet by mouth prior to dental procedure as directed by dental staff     losartan 100 MG tablet  Commonly known as:  COZAAR  Take 100 mg by mouth daily.     methylcellulose 1 % ophthalmic solution  Commonly known as:  ARTIFICIAL TEARS  Place 1 drop into both eyes 2 (two) times daily. For dry eyes     MIRALAX powder  Generic drug:  polyethylene glycol powder  Take 17 g by mouth daily.        Meds ordered this encounter  Medications  .  hydrochlorothiazide (HYDRODIURIL) 25 MG tablet    Sig: Take 25 mg by mouth daily.    Immunization History  Administered Date(s) Administered  . Influenza Whole 11/25/2012    History  Substance Use Topics  . Smoking status: Never Smoker   . Smokeless tobacco: Not on file  . Alcohol Use: No    Family history is noncontributory    Review of Systems  DATA OBTAINED: UTO much 2/2 pt aphasia.from patient; pt shakes head yes she has a headache, no her chest doesn't hurt  Filed Vitals:   08/27/13 1717  BP: 194/142  Pulse: 138  Temp: 98.1 F (36.7 C)  Resp: 24    Physical Exam  GENERAL APPEARANCE: Alert, nonconversant. Appropriately groomed nursing staff says LOC is usual for her but she is acting like she is uncomfortable.  SKIN: + diaphoresis  HEAD: Normocephalic, atraumatic  EYES: Conjunctiva/lids clear. Pupils round, reactive. EOMs intact.  EARS: External exam WNL, canals clear. Hearing grossly normal.  NOSE: No deformity or discharge.  MOUTH/THROAT: Lips w/o lesions  RESPIRATORY: Breathing is even, unlabored. Lung sounds are clear   CARDIOVASCULAR: Heart RRR no murmurs, rubs or gallops. No peripheral edema.  GASTROINTESTINAL:  Abdomen is soft, non-tender, not distended w/ normal bowel sounds; mils epigastric TTP GENITOURINARY: Bladder non tender, not distended  MUSCULOSKELETAL: contractures all ext except LUE NEUROLOGIC: facial assymetry, not new. Moves LUE only; pt does not appear post ictal  Patient Active Problem List   Diagnosis Date Noted  . VP (ventriculoperitoneal) shunt status 08/27/2013  . Abdominal pain, unspecified site 08/19/2013  . Pre-ulcerative calluses 03/23/2013  . Chronic kidney disease, unspecified 11/27/2012  . 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 Unspecified disorder of kidney and ureter 11/03/2012  . Acute renal failure 09/17/2012  . Seizures 08/26/2012  . Anemia of other chronic disease 08/11/2012  . Essential  hypertension, benign 06/24/2012  . Alzheimer's disease 06/24/2012  . Unspecified late effects of cerebrovascular disease 06/24/2012  . Unspecified constipation 06/24/2012  . Dementia   . Diabetes mellitus   . High blood pressure   . Glaucoma   . Cataracts, both eyes   . Hyperlipemia   . Depression   . Constipation   . Anemia   . Stroke   . Dysphagia, unspecified(787.20)   . Dementia, unspecified, without behavioral disturbance         Assessment and Plan  High blood pressure Review of a years worth of notes and no mention of VERY high BP. Two days ago pt had BP 220/240 ish , on repeat 150/90. 48 hours later BP very elevated again and pt admit HA and looks uncomfortable. Pt has documented having VP shunt; I can't palpate a line but if she has a shunt and BP is this high twice in 2 days makes me worried.  Seizures Pt was noted to have total body shaking that resolved by itself. Pt did not appear post ictal but she only has facial features and head nods to interpret. She did appear uncomfortable and did admit to HA. Seizure cannot be ruled out here  VP (ventriculoperitoneal) shunt status Didn't feel a shunt line but with hx it has to be considered that it is there.If it is and her BP is elevated all of a sudden and pt had a seizure there is a problem.  UTI - [facility] with cipro 250 BID starting on 7/13. GFR 45, Cr 1.3-1.5  PT BEING SENT TO ED FOR EVAL   Margit HanksALEXANDER, Davan Nawabi D, MD

## 2013-08-28 ENCOUNTER — Encounter: Payer: Self-pay | Admitting: Internal Medicine

## 2013-08-28 NOTE — Assessment & Plan Note (Signed)
Review of a years worth of notes and no mention of VERY high BP. Two days ago pt had BP 220/240 ish , on repeat 150/90. 48 hours later BP very elevated again and pt admit HA and looks uncomfortable. Pt has documented having VP shunt; I can't palpate a line but if she has a shunt and BP is this high twice in 2 days makes me worried.

## 2013-08-28 NOTE — Assessment & Plan Note (Signed)
Didn't feel a shunt line but with hx it has to be considered that it is there.If it is and her BP is elevated all of a sudden and pt had a seizure there is a problem.

## 2013-08-28 NOTE — Assessment & Plan Note (Signed)
Pt was noted to have total body shaking that resolved by itself. Pt did not appear post ictal but she only has facial features and head nods to interpret. She did appear uncomfortable and did admit to HA. Seizure cannot be ruled out here

## 2013-08-29 ENCOUNTER — Encounter: Payer: Self-pay | Admitting: Internal Medicine

## 2013-08-29 DIAGNOSIS — I499 Cardiac arrhythmia, unspecified: Secondary | ICD-10-CM | POA: Insufficient documentation

## 2013-08-29 DIAGNOSIS — N390 Urinary tract infection, site not specified: Secondary | ICD-10-CM | POA: Insufficient documentation

## 2013-08-29 NOTE — Progress Notes (Signed)
Patient ID: Katie BravoJuanita House, female   DOB: 10/18/1940, 73 y.o.   MRN: [id number]   This is an acute visit.  Level of care skilled.  Facility AF.   Chief complaint-acute visit secondary to rash on left arm-followup UTI-hypertension .  History of present illness .  Patient is a 73 year old female with a history of CVA with resulting right-sided weakness as well as what appears to be vascular dementia.  I have seen her recently for some possibility of abdominal discomfort x-ray and ultrasound showed essentially gallstones that did not appear to be acute-a urine also was obtained which was suspicious for UTI and she has been started on Cipro.  Apparently yesterday her blood pressure shot up to systolic of 200-however on recheck it had come down to the 150s-that is what her blood pressure is today.  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   Family medical social history as been reviewed including note on 08/16/2013 .  Medications have been reviewed per MAR.   Review of systems-essentially unattainable secondary to patient's a relatively aphasic status please see history of present illness according to nursing staff she appears to be at her baseline apparently appetite is stable.   Physical exam.  She is afebrile pulse of 84 respirations of 20 blood pressure less noted 154/92-.  In general this is a elderly female in no distress lying  comfortably in bed.  Her skin is warm and dry on her left arm there is a very pale rash on her lower arm --according to nursing staff this is improved from yesterday --this is confined mainly to the top of her arm.  Chest is clear to auscultation there is no labored breathing.  Heart is regular rate and rhythm with some irregular beats-.  Abdomen is obese soft with minimal tenderness to palpation this is hard to localize-and maybe more result of the invasive maneuver-bowel sounds are positive.  Muscle skeletal continues with right-sided weakness which is her baseline.  Neurologic as stated above she is relatively aphasic which is her baseline .  Labs.  08/24/2013.  Sodium 140 potassium 4.2 BUN 20 creatinine 1.2-albumin 3.3 otherwise liver function tests within normal limits.  WBC 7.9 hemoglobin 11.1 platelets 178.   Assessment and plan.  #1-UTI-she is on a ten-day course of Cipro this was started on July 13-this appears to be stable she is afebrile with minimal abdominal discomfort.  #2-history of abdominal discomfort-this appears to be stable and fairly minimal at this time continue to monitor studies as noted above did show what I suspect her chronic gallstones-.  #3-hypertension-at this point she is variable systolics-will order vital signs every shift with a log for review next week so we can get more readings and see the consistency of this-also call provider for systolic greater than 170 or diastolic greater than 96.  #4-some irregular beats on cardiac exam-we'll order an EKG again clinically she  appears stable in this regard.  #5-arm rash-this appears to be resolving however will have to be monitored  [id number]

## 2013-08-29 NOTE — Progress Notes (Signed)
Patient ID: Katie House, female   DOB: 10/29/1940, 73 y.o.   MRN: [id number]  This encounter was created in error - please disregard.

## 2013-08-29 NOTE — Progress Notes (Signed)
Patient ID: Katie House, female   DOB: Mar 09, 1940, 73 y.o.   MRN: [id number]   This is an acute visit.  Level of care skilled.  Facility AF.  Chief complaint-acute visit secondary to rash on left arm-followup UTI-hypertension.  History of present illness.  Patient is a 73 year old female with a history of CVA with resulting right-sided weakness as well as what appears to be vascular dementia.  I have seen her recently for some possibility of abdominal discomfort x-ray and ultrasound showed essentially gallstones that did not appear to be acute-a urine also was obtained which was suspicious for UTI and she has been started on Cipro.  Apparently yesterday her blood pressure shot up to systolic of 200-however on recheck it had come down to the 150s-that is when her blood pressure is today.  She does have a history of renal vascular hypertension-was somewhat variable systolics in appears her baseline previously had been more in the 130s  This will have to be monitored and we'll order vital signs every shift so we can get more readings to obtain a better idea where she really stands  She is on Losartant 100 mg a day as well as hydrochlorothiazide 25  Nursing staff also was noted a rash on her left arm yesterday-however her evaluation today this has improved it is less pale looks less impressive --there is some thought possibly this was caused by the blood pressure cuff on her arm yesterday.  Family medical social history as been reviewed including note on 08/16/2013.  Medications have been reviewed per MAR.  Review of systems-essentially unattainable secondary to patient's a relatively aphasic status please see history of present illness according to nursing staff she appears to be at her baseline apparently appetite is stable.  Physical exam.  She is afebrile pulse of 84 respirations of 20 blood pressure less noted 154/92-.  In general this is a elderly female in no distress lying  comfortably in bed.  Her skin is warm and dry on her left arm there is a very pale rash on her lower arm --according to nursing staff this is improved from yesterday --this is confined mainly to the top of her arm.  Chest is clear to auscultation there is no labored breathing.  Heart is regular rate and rhythm with some irregular beats-.  Abdomen is obese soft with minimal tenderness to palpation this is hard to localize-and maybe more result of the invasive maneuver-bowel sounds are positive.  Muscle skeletal continues with right-sided weakness which is her baseline.  Neurologic as stated above she is relatively aphasic which is her baseline.  Labs.  08/24/2013.  Sodium 140 potassium 4.2 BUN 20 creatinine 1.2-albumin 3.3 otherwise liver function tests within normal limits.  WBC 7.9 hemoglobin 11.1 platelets 178.  Assessment and plan.  #1-UTI-she is on a ten-day course of Cipro this was started on July 13-this appears to be stable she is afebrile with minimal abdominal discomfort.  #2-history of abdominal discomfort-this appears to be stable and fairly minimal at this time continue to monitor studies as noted above did show what I suspect her chronic gallstones-.  #3-hypertension-at this point she is variable systolics-will order vital signs every shift with a log for review next week so we can get more readings and see the consistency of this-also call provider for systolic greater than 170 or diastolic greater than 96.  #4-some irregular beats on cardiac exam-we'll order an EKG again clinically she appears stable in this regard.  #5-arm rash-this appears  to be resolving however will have to be monitored  [id number]  08/18/2013.  WBC 8.8 hemoglobin 12.2 platelets 203.  Sodium 141 potassium 4.3 BUN 25 creatinine 1.27.  Liver function tests within normal limits except ALT of 50-.  Marland Kitchen.  -

## 2013-08-30 ENCOUNTER — Non-Acute Institutional Stay (SKILLED_NURSING_FACILITY): Payer: Medicare Other | Admitting: Internal Medicine

## 2013-08-30 DIAGNOSIS — R569 Unspecified convulsions: Secondary | ICD-10-CM

## 2013-08-30 DIAGNOSIS — I10 Essential (primary) hypertension: Secondary | ICD-10-CM

## 2013-08-30 DIAGNOSIS — N390 Urinary tract infection, site not specified: Secondary | ICD-10-CM

## 2013-09-04 ENCOUNTER — Encounter: Payer: Self-pay | Admitting: Internal Medicine

## 2013-09-04 NOTE — Progress Notes (Signed)
Patient ID: Katie House, female   DOB: 08/31/1940, 73 y.o.   MRN: [id number]  this is an acute visit.  Level of care skilled.  Facility AF.  Chief complaint-acute visit followup her to ER for possible seizure hypertension.  History of present illness.  Patient is a 73 year old female with a history of CVA and dementia as well seizure disorder-she has been receiving Cipro was well for a Escherichia coli UTI has had a 7 day course of this appears.  Apparently last week she developed what appeared to be possibly seizure with an elevated blood pressure-she was sent to the ER-we don't have a whole-body information from the ER but it appears she was sent back after receiving a dose of clonidine which brought her blood pressure down-this was done IV.  She also got Keppra in the hospital.  Apparently she was stable and sent back-talking to her daughter apparently a CT scan of her VP shunt was negative for anything acute.  She continues Losartan 100 mg daily as well as Keppra 500 mg twice a day  Currently patient appears to be more at her baseline however it appears her blood pressure is elevated today at 200/100-appears otherl her blood pressures  since she's come back have been 110/60-130/60  Patient is relatively aphasic and cannot give any review of systems she has been afebrile.  Family medical social history has been reviewed her previous progress notes most recently 08/27/2013.  Medications have been reviewed per MAR.  Review of systems.  Please see history of present illness again at patient apparently has been at her baseline this is quite limited secondary to dementia and relative aphasia.  Physical exam.  Temperature is 97.9 pulse 95 respirations 18 blood pressure taken man weight 200/100-this has been somewhat of a challenge patient is somewhat resistant to getting manual blood pressures done.  In general this is a frail elderly female in no distress.  Her skin is warm and  dry--do note on her right hand palmar area appears to be possibly some small bruising radial pulse is intact this does not appear to be tender to palpation or swollen or warm.  Chest is clear to auscultation no labored breathing.  Heart is regular rate and rhythm with an occasional irregular beat.   abdomen is soft nontender with positive bowel sounds.  Muscle skeletal continues with right-sided hemiparesis this is baseline she is able to move her left extremities again is somewhat agitated with trying to take blood pressures in her arm.  Neurologic as stated above does have baseline right-sided hemiparesis I do not note any tremor today  Psych again she appears to have significant dementia with relative aphasia this appears to be baseline  Labs.  08/18/2013.  WBC 8.8 hemoglobin 12.2 platelets 203.  Sodium 141 potassium 4.3 BUN 25 creatinine 1.27.  Assessment and plan.  #1-question seizure-this was discussed with Dr. Lyn HollingsheadAlexander via phone who actually sent her to the ER last week-at this time we'll keep current Keppra dose 500 twice a day-somewhat uncertain etiology if this was a true seizure-w close monitoring for any further episodes-also will update lab work including a CBC and metabolic panel tomorrow as well as a TSH.  #2 hypertension this appears to be an episodic matter with normal blood pressure and then it shoots up-this also was discussed with Dr. Lyn HollingsheadAlexander via phone at this point will give her clonidine 0.1 mg now and recheck in 1 hour and 2 hours notify provider systolic is greater than 170  or diastolic greater than or equal to 95-continue to monitor blood pressure and pulse every 4 hours notify provider again if systolic greater than 170 or diastolic remains greater than 98.  #3 UTI-she has had a week of Cipro-quinolones at times can lower seizure threshold at this point will DC the Cipro since she has received a fairly prolonged course of this of this we'll obtain a urine  culture for followup of the infection  #4-mild bruising right hand-unclear etiology Will order an x-ray and monitor area for any changes  CPT-[id number].    Marland Kitchen

## 2013-09-06 NOTE — Addendum Note (Signed)
Addended by: Roena MaladyLASSEN, Zniyah Midkiff C on: 09/06/2013 02:22 PM   Modules accepted: Level of Service, SmartSet

## 2013-09-06 NOTE — Progress Notes (Signed)
This encounter was created in error - please disregard.

## 2013-09-08 NOTE — Addendum Note (Signed)
Addended by: Roena MaladyLASSEN, Elowen Debruyn C on: 09/08/2013 04:53 PM   Modules accepted: Level of Service, SmartSet

## 2013-09-08 NOTE — Progress Notes (Signed)
This encounter was created in error - please disregard.

## 2014-01-03 ENCOUNTER — Non-Acute Institutional Stay (SKILLED_NURSING_FACILITY): Payer: Medicare Other | Admitting: Internal Medicine

## 2014-01-03 ENCOUNTER — Encounter: Payer: Self-pay | Admitting: Internal Medicine

## 2014-01-03 DIAGNOSIS — I10 Essential (primary) hypertension: Secondary | ICD-10-CM

## 2014-01-03 DIAGNOSIS — I6990 Unspecified sequelae of unspecified cerebrovascular disease: Secondary | ICD-10-CM

## 2014-01-03 DIAGNOSIS — N189 Chronic kidney disease, unspecified: Secondary | ICD-10-CM

## 2014-01-03 DIAGNOSIS — R569 Unspecified convulsions: Secondary | ICD-10-CM

## 2014-01-03 DIAGNOSIS — F0390 Unspecified dementia without behavioral disturbance: Secondary | ICD-10-CM

## 2014-01-03 NOTE — Progress Notes (Signed)
Patient ID: Katie BravoJuanita House, female   DOB: 10/28/1940, 73 y.o.   MRN: [id number] Patient ID: Katie House, female   DOB: 12/12/1940, 73 y.o.   MRN: [id number]   This is a routine visit.  Level of care skilled.  Facility AF.   Chief complaint-medical management of chronic issues including dementia-history CVA-seizure disorder-hypertension-chronic kidney disease.   History of present illness.  Patient is a 73 year old female with the above diagnoses- she has a history of severe dementia with history of CVA with right hemiparesis global aphasia visual deficits and dysphagia.   She has had a relative. If stability here-this summer she was sent to the ER with significantly elevated blood pressure and possible seizure-this was treated with clonidine she also received Keppra she does continue on Keppra and there is been no further seizure activity to my knowledge.  She does have a history of a VP shunt study during her brief hospitalization there did not show any change in that status.  In regards to blood pressure she is on Cozaar 100 mg a day-and Norvasc 5 mg a day--recent blood pressures 131/80-155/67  Nursing left a note about patient being somewhat lethargic yesterday however according to nursing today she is at her baseline todaywhen I saw her she appeared to be comparable to previous visits she is a phasic but was responsive and awake    Family medical social history has been reviewed per previous progress notes as well as history and physical on 01/29/2011.   Medications have been reviewed per MAR.   Review of systems essentially unobtainable secondary to patient's aphasic status please see history of present illness nursing staff has not noted any increased complaints of pain shortness of breath . -Per discussion with nursing and her daughter patient has a poor appetite earlier in the day but appears to pick up later in the day her daughter frequently visits later in the day and I suspect this  helps with her by mouth intake  Physical exam.   Temperature 97.9 pulse 72 respirations 20 blood pressure 155/67-131/80 most recently .  In general this is a frail elderly female in no distress resting comfortably in bed.  Her skin is warm and dry she does appear to have a mouth droop.  Oropharynx was difficult to assess patient did not really open her mouth very wide.  Eyes she does appear to have bilateral cataracts but limited vision is apparently his baseline sclera and conjunctiva are clear.  Chest is clear to auscultation with poor respiratory effort no labored breathing.  Heart is regular rate and rhythm with occasional irregular beats there is no lower extremity edema.  Abdomen is soft nontender with positive bowel sounds.  Muscle skeletal does have right-sided weakness with contracture of her right upper extremity wrist and hand this is baseline.  Neurologic again right-sided deficits as noted above she is a phasic.   Labs  12/07/2013.  Sodium 139 potassium 4.5 BUN 21 creatinine 1.3  09/10/2013.  WBC 9.0 hemoglobin 11.2 platelets 221.  08/31/2013.  Liver function tests within normal limits except albumin of 3.4.  TSH-0.968 07/27/2013.  Sodium 142 potassium 4.6 BUN 25 creatinine 1.5.  07/08/2013.  WBC 7.7 hemoglobin 11.1 platelets 182.  Hemoglobin A1c 6.1.  Liver function tests within normal limits except albumin of 3.4.  May 18th 2015.  Sodium 140 potassium 4.5 BUN 21 creatinine 1.4 this appears to be relatively baseline.  Jan- 22nd 2015.  WBC 7.4 hemoglobin 10.7 platelets 182.  Liver function tests within  normal limits except albumin of 3.3   Assessment and plan.  #1-dementia this appears to be quite advanced apparently however she is quite stable continue to monitor currently on no medication.--Apparently she has poor by mouth intake earlier in the day-but this improves later in the day clinically she appears to be at baseline Will update a metabolic panel  and check her albumin  #2 hypertension this appears relatively well controlled she is on losartan. As well as Norvasc  #3 history seizure disorder she continues on Keppra this has been stable.  #4 history of CVA continue supportive care she does receive tramadol once a day for pain issues apparently this is helping.  #5-history of anemia this appears relatively stable we'll update CBC.  #6 renal insufficiency baseline creatinine appears to run in the low mid ones we'll update this #7-?? lethargy yesterday-I do not really see evidence of that today nursing staff has not noted it either-I did discuss this with her daughter  in the facility as well and she will let us know if she notices anything different-will order lab work as noted above including a CBC CMP and a TSH Also monitorvital signs pulse ox every shift for 48 hours    [id number]    . .Marland Kitchen

## 2014-02-14 LAB — HEPATIC FUNCTION PANEL
ALT: 48 U/L — AB (ref 7–35)
AST: 22 U/L (ref 13–35)
Alkaline Phosphatase: 73 U/L (ref 25–125)
Bilirubin, Total: 0.6 mg/dL

## 2014-02-17 ENCOUNTER — Non-Acute Institutional Stay (SKILLED_NURSING_FACILITY): Payer: Medicare Other | Admitting: Internal Medicine

## 2014-02-17 ENCOUNTER — Encounter: Payer: Self-pay | Admitting: Internal Medicine

## 2014-02-17 DIAGNOSIS — R109 Unspecified abdominal pain: Secondary | ICD-10-CM | POA: Insufficient documentation

## 2014-02-17 DIAGNOSIS — R1084 Generalized abdominal pain: Secondary | ICD-10-CM

## 2014-02-17 DIAGNOSIS — N189 Chronic kidney disease, unspecified: Secondary | ICD-10-CM

## 2014-02-17 DIAGNOSIS — Z982 Presence of cerebrospinal fluid drainage device: Secondary | ICD-10-CM

## 2014-02-17 DIAGNOSIS — I10 Essential (primary) hypertension: Secondary | ICD-10-CM

## 2014-02-17 DIAGNOSIS — F0390 Unspecified dementia without behavioral disturbance: Secondary | ICD-10-CM

## 2014-02-17 DIAGNOSIS — I6990 Unspecified sequelae of unspecified cerebrovascular disease: Secondary | ICD-10-CM

## 2014-02-18 LAB — HEMOGLOBIN A1C: HEMOGLOBIN A1C: 6.7 % — AB (ref 4.0–6.0)

## 2014-02-20 NOTE — Progress Notes (Signed)
Patient ID: Katie House, female   DOB: 1940-08-20, 74 y.o.   MRN: [id number]  :       This is a routine visit.  Level of care skilled.  Facility [facility].   Chief complaint-medical management of chronic issues including dementia-history CVA-seizure disorder-hypertension-chronic kidney disease.   History of present illness.  Patient is a 74 year old female with the above diagnoses- she has a history of severe dementia with history of CVA with right hemiparesis global aphasia visual deficits and dysphagia.   She has had a relative. If stability here-this summer she was sent to the ER with significantly elevated blood pressure and possible seizure-this was treated with clonidine she also received Keppra she does continue on Keppra and there is been no further seizure activity to my knowledge.--The pressures appear to be variable I doubt 140/70 manually this evening I see listed 1 of 162/81  She does have a history of a VP shunt study during her brief hospitalization there did not show any change in that status.  In regards to blood pressure she is on Cozaar 100 mg a day-and Norvasc 5 mg a day--  Apparently earlier this week patient had possibly some complaints of abdominal discomfort-abdominal xray did not show any acute process apparently there's been no recurrence here patient appears to be at her baseline today she is a poor historian secondary to dementia a phasic status Blood work including liver function tests and amylase and lipase were unremarkable   Family medical social history has been reviewed per previous progress notes as well as history and physical on 01/29/2011.   Medications have been reviewed per MAR.   Review of systems essentially unobtainable secondary to patient's aphasic status please see history of present illness nursing staff has not noted any increased complaints of pain shortness of breath .    Physical exam.   Temperature 97.2 pulse 85 respirations 22 blood  pressure 140/70 .  In general this is a frail elderly female in no distress resting comfortably in bed.  Her skin is warm and dry she does appear to have a mouth droop.  Oropharynx was difficult to assess patient did not really open her mouth very wide.  Eyes she does appear to have bilateral cataracts but limited vision is apparently his baseline sclera and conjunctiva are clear.  Chest is clear to auscultation with poor respiratory effort no labored breathing.  Heart is regular rate and rhythm with occasional irregular beats there is no lower extremity edema.  Abdomen is soft does not appear to be acutely tender-  with positive bowel sounds.  Muscle skeletal does have right-sided weakness with contracture of her right upper extremity wrist and hand this is baseline.  Neurologic again right-sided deficits as noted above she is a phasic.   Labs  02/14/2014.  WBC 10.6 hemoglobin 12.1 platelets 225.  Sodium 138 potassium 4.1 BUN 28 creatinine 1.2.  Glucose 194.  Liver function tests within normal limits.  Amylase 82-lipase 35  12/07/2013.  Sodium 139 potassium 4.5 BUN 21 creatinine 1.3  09/10/2013.  WBC 9.0 hemoglobin 11.2 platelets 221.  08/31/2013.  Liver function tests within normal limits except albumin of 3.4.  TSH-0.968 07/27/2013.  Sodium 142 potassium 4.6 BUN 25 creatinine 1.5.  07/08/2013.  WBC 7.7 hemoglobin 11.1 platelets 182.  Hemoglobin A1c 6.1.  Liver function tests within normal limits except albumin of 3.4.  May 18th 2015.  Sodium 140 potassium 4.5 BUN 21 creatinine 1.4 this appears to be relatively baseline.  Jan- 22nd 2015.  WBC 7.4 hemoglobin 10.7 platelets 182.  Liver function tests within normal limits except albumin of 3.3   Assessment and plan.  #1-dementia this appears to be quite advanced apparently however she is quite stable continue to monitor currently on no medication.-   #2 hypertension this appears variable she is on losartan.  As well as Norvasc --this point continue to monitor would like to get more blood pressure readings #3 history seizure disorder she continues on Keppra this has been stable. - #4 history of CVA continue supportive care she does receive tramadol once a day for pain issues apparently this is helping.  #5-history of anemia this appears relatively stable .  #6 renal insufficiency baseline creatinine appears to run in the low mid ones-recent labs show stability #7-elevated glucose a metabolic panel-will check CBGs daily for 3 days also update hemoglobin A1c 8-abdominal pain-could not really appreciate this today on exam  per nursing staff  at her baseline this will have to be monitored closely will check vital signs pulse ox every shift for 72 hours  [id number]       [id number]    . .Marland Kitchen

## 2014-03-07 ENCOUNTER — Non-Acute Institutional Stay (SKILLED_NURSING_FACILITY): Payer: Medicare Other | Admitting: Internal Medicine

## 2014-03-07 DIAGNOSIS — H109 Unspecified conjunctivitis: Secondary | ICD-10-CM

## 2014-03-07 NOTE — Progress Notes (Signed)
Patient ID: Katie House, female   DOB: 10/09/1940, 74 y.o.   MRN: [id number]   This is an acute visit.  Level care skilled.  Facility [facility].  Chief complaint-acute visit secondary to erythematous left eye.  History of present illness.  Patient is a 74 year old female who has developed some erythema of her left eye-she has significant dementia with history CVA and cannot really give us a significant review of systems.  However it appears this is not acutely painful-.  Family medical social history as been reviewed per recent note on 02/17/2014.  [facility]-as stated above patient is relatively aphasic and cannot give any review of systems.  Physical exam.  She is afebrile pulse of 98 respirations 18 blood pressure 137/71.  In general this is a frail elderly female in no distress lying comfortably in bed.  Her skin is warm and dry.  Eyes her left eye conjunctiva is erythematous I also see a small amount of exudate at both lid margins bilaterally-. She has a history of bilateral cataracts  Chest is clear to auscultation there is no labored breathing there is poor effort.  Heart is regular rate and rhythm with occasional irregular beats.  Abdomen soft does not appear to be tender there are positive bowel sounds.  Labs.  Jan fourth 2016.  WBC 10.6 hemoglobin 12.1 platelets 225.  Sodium 138 potassium 4.5 BUN 28 creatinine 1.2.  Assessment and plan.  Conjunctivitis-will treat with Tobrex ophthalmologic solution 0.3% 2 drops 3 times a day both eyes for 7 days and monitor for resolution.  [id number]

## 2014-03-13 DIAGNOSIS — H109 Unspecified conjunctivitis: Secondary | ICD-10-CM | POA: Insufficient documentation

## 2014-03-24 ENCOUNTER — Emergency Department (HOSPITAL_COMMUNITY): Payer: Medicare Other

## 2014-03-24 ENCOUNTER — Emergency Department (HOSPITAL_COMMUNITY)
Admission: EM | Admit: 2014-03-24 | Discharge: 2014-03-24 | Disposition: A | Discharge status: Home / Self Care | Payer: Medicare Other | Attending: Emergency Medicine | Admitting: Emergency Medicine

## 2014-03-24 ENCOUNTER — Encounter (HOSPITAL_COMMUNITY): Payer: Self-pay | Admitting: Emergency Medicine

## 2014-03-24 DIAGNOSIS — E119 Type 2 diabetes mellitus without complications: Secondary | ICD-10-CM | POA: Diagnosis not present

## 2014-03-24 DIAGNOSIS — Z043 Encounter for examination and observation following other accident: Secondary | ICD-10-CM | POA: Insufficient documentation

## 2014-03-24 DIAGNOSIS — Y9289 Other specified places as the place of occurrence of the external cause: Secondary | ICD-10-CM | POA: Insufficient documentation

## 2014-03-24 DIAGNOSIS — Z79899 Other long term (current) drug therapy: Secondary | ICD-10-CM | POA: Insufficient documentation

## 2014-03-24 DIAGNOSIS — R609 Edema, unspecified: Secondary | ICD-10-CM | POA: Insufficient documentation

## 2014-03-24 DIAGNOSIS — W06XXXA Fall from bed, initial encounter: Secondary | ICD-10-CM | POA: Insufficient documentation

## 2014-03-24 DIAGNOSIS — Z8719 Personal history of other diseases of the digestive system: Secondary | ICD-10-CM | POA: Diagnosis not present

## 2014-03-24 DIAGNOSIS — Z91040 Latex allergy status: Secondary | ICD-10-CM | POA: Insufficient documentation

## 2014-03-24 DIAGNOSIS — Z862 Personal history of diseases of the blood and blood-forming organs and certain disorders involving the immune mechanism: Secondary | ICD-10-CM | POA: Diagnosis not present

## 2014-03-24 DIAGNOSIS — Y9389 Activity, other specified: Secondary | ICD-10-CM | POA: Insufficient documentation

## 2014-03-24 DIAGNOSIS — Y998 Other external cause status: Secondary | ICD-10-CM | POA: Insufficient documentation

## 2014-03-24 DIAGNOSIS — H409 Unspecified glaucoma: Secondary | ICD-10-CM | POA: Diagnosis not present

## 2014-03-24 DIAGNOSIS — H269 Unspecified cataract: Secondary | ICD-10-CM | POA: Diagnosis not present

## 2014-03-24 DIAGNOSIS — Z8673 Personal history of transient ischemic attack (TIA), and cerebral infarction without residual deficits: Secondary | ICD-10-CM | POA: Diagnosis not present

## 2014-03-24 DIAGNOSIS — F329 Major depressive disorder, single episode, unspecified: Secondary | ICD-10-CM | POA: Diagnosis not present

## 2014-03-24 DIAGNOSIS — W19XXXA Unspecified fall, initial encounter: Secondary | ICD-10-CM

## 2014-03-24 DIAGNOSIS — F0390 Unspecified dementia without behavioral disturbance: Secondary | ICD-10-CM | POA: Insufficient documentation

## 2014-03-24 NOTE — Discharge Instructions (Signed)
Fall Prevention and Home Safety Katie House was seen for a fall.  CT scan was normal.  Follow up with your primary care physician regarding preventing falls at home. If symptoms worsen come back to emergency department immediately. Thank you. Falls cause injuries and can affect all age groups. It is possible to prevent falls.  HOW TO PREVENT FALLS  Wear shoes with rubber soles that do not have an opening for your toes.  Keep the inside and outside of your house well lit.  Use night lights throughout your home.  Remove clutter from floors.  Clean up floor spills.  Remove throw rugs or fasten them to the floor with carpet tape.  Do not place electrical cords across pathways.  Put grab bars by your tub, shower, and toilet. Do not use towel bars as grab bars.  Put handrails on both sides of the stairway. Fix loose handrails.  Do not climb on stools or stepladders, if possible.  Do not wax your floors.  Repair uneven or unsafe sidewalks, walkways, or stairs.  Keep items you use a lot within reach.  Be aware of pets.  Keep emergency numbers next to the telephone.  Put smoke detectors in your home and near bedrooms. Ask your doctor what other things you can do to prevent falls. Document Released: 11/24/2008 Document Revised: 07/30/2011 Document Reviewed: 04/30/2011 [facility] Patient Information 2015 [employer] [place]. This information is not intended to replace advice given to you by your health care provider. Make sure you discuss any questions you have with your health care provider.

## 2014-03-24 NOTE — ED Notes (Signed)
[facility], had an unwitnessed fall. Staff found pt on the floor out of her bed. Pt is non verbal, per norm and has complete paralysis to rt side. Pt has hematoma to back left side of head. No bleeding or laceration noted. Pt nods head yes or no to answer questions and points to head when asked is she having any pain.

## 2014-03-24 NOTE — ED Notes (Signed)
Discharge instructions reviewed with Ngozi.

## 2014-03-24 NOTE — ED Provider Notes (Signed)
CSN: [id number]     Arrival date & time 03/24/14  4098 History  This chart was scribed for Tomasita Crumble, MD by Bronson Curb, ED Scribe. This patient was seen in room D32C/D32C and the patient's care was started at 3:53 AM.   Chief Complaint  Patient presents with  . Fall    The history is provided by the patient and the nursing home.     HPI Comments: Katie House is a 74 y.o. female, with history of dementia, DM, HLD, stroke, brought in by ambulance, who presents to the Emergency Department for an unwitnessed fall that occurred PTA. Patient is a resident and [facility], and staff reports they found her on the the floor and suspects she may have rolled out of bed. Patient is nonverbal at baseline. There is associated pain to the back of the head. No laceration or active bleeding noted.   Past Medical History  Diagnosis Date  . Dementia   . Diabetes mellitus   . High blood pressure   . Glaucoma   . Cataracts, both eyes   . Hyperlipemia   . Depression   . Constipation   . Anemia   . Stroke   . Dysphagia, unspecified(787.20)   . Dementia, unspecified, without behavioral disturbance    History reviewed. No pertinent past surgical history. History reviewed. No pertinent family history. History  Substance Use Topics  . Smoking status: Never Smoker   . Smokeless tobacco: Not on file  . Alcohol Use: No   OB History    No data available     Review of Systems  Unable to perform ROS: Patient nonverbal       Allergies  Latex  Home Medications   Prior to Admission medications   Medication Sig Start Date End Date Taking? Authorizing Provider  amLODipine (NORVASC) 5 MG tablet Take 5 mg by mouth daily.    Historical Provider, MD  DULoxetine (CYMBALTA) 30 MG capsule Take 30 mg by mouth daily.    Historical Provider, MD  hydrochlorothiazide (HYDRODIURIL) 25 MG tablet Take 25 mg by mouth daily.    Historical Provider, MD  levETIRAcetam (KEPPRA) 500  MG tablet Take 500 mg by mouth 2 (two) times daily. For seizure disorder    Historical Provider, MD  LORazepam (ATIVAN) 1 MG tablet Take 1 mg by mouth. 1 tablet by mouth prior to dental procedure as directed by dental staff    Historical Provider, MD  losartan (COZAAR) 100 MG tablet Take 100 mg by mouth daily.    Historical Provider, MD  methylcellulose (ARTIFICIAL TEARS) 1 % ophthalmic solution Place 1 drop into both eyes 2 (two) times daily. For dry eyes    Historical Provider, MD  polyethylene glycol powder (MIRALAX) powder Take 17 g by mouth daily.    Historical Provider, MD   Pulse 103  Temp(Src) 97.3 F (36.3 C) (Oral)  Resp 24  SpO2 97% Physical Exam  Constitutional: She is oriented to person, place, and time. She appears well-developed and well-nourished. No distress.  Nonverbal, nods yes or no to questioning. Wearing a diaper.  HENT:  Head: Normocephalic and atraumatic.  Nose: Nose normal.  Mouth/Throat: Oropharynx is clear and moist. No oropharyngeal exudate.  No evidence of trauma on her head.  Eyes: Conjunctivae and EOM are normal. Pupils are equal, round, and reactive to light. No scleral icterus.  Neck: Normal range of motion. Neck supple. No JVD present. No tracheal deviation present. No thyromegaly present.  Cardiovascular:  Normal rate, regular rhythm and normal heart sounds.  Exam reveals no gallop and no friction rub.   No murmur heard. Pulmonary/Chest: Effort normal and breath sounds normal. No respiratory distress. She has no wheezes. She exhibits no tenderness.  Abdominal: Soft. Bowel sounds are normal. She exhibits no distension and no mass. There is no tenderness. There is no rebound and no guarding.  Musculoskeletal: Normal range of motion. She exhibits edema. She exhibits no tenderness.  1+ edema.  Lymphadenopathy:    She has no cervical adenopathy.  Neurological: She is alert and oriented to person, place, and time. No cranial nerve deficit. She exhibits normal  muscle tone.  Normal strength in all 4 extremities. Chronic contracture of BLE.   Skin: Skin is warm and dry. No rash noted. No erythema. No pallor.  Nursing note and vitals reviewed.   ED Course  Procedures (including critical care time)  DIAGNOSTIC STUDIES: Oxygen Saturation is 97% on room air, adequate by my interpretation.    COORDINATION OF CARE: At 300357 Discussed treatment plan with patient. Patient agrees.   Labs Review Labs Reviewed - No data to display  Imaging Review Ct Head Wo Contrast  03/24/2014   CLINICAL DATA:  Unwitnessed fall at rehabilitation facility. LEFT posterior head hematoma.  EXAM: CT HEAD WITHOUT CONTRAST  TECHNIQUE: Contiguous axial images were obtained from the base of the skull through the vertex without intravenous contrast.  COMPARISON:  None.  FINDINGS: No intraparenchymal hemorrhage, mass effect, midline shift or acute large vascular territory infarct. LEFT cerebrum encephalomalacia with ex vacuo dilatation of LEFT lateral ventricle. No hydrocephalus. LEFT wallerian degeneration. Normal appearance of the cerebellum. Basal cisterns are patent. No abnormal extra-axial fluid collections. Remote LEFT middle cerebral artery (M1) stent.  Moderate LEFT frontal scalp hematoma without subcutaneous gas or radiopaque foreign bodies. 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 No skull fracture. Ocular globes and orbital contents are unremarkable. Trace RIGHT sphenoid sinus air-fluid level.  IMPRESSION: Moderate LEFT frontal scalp hematoma. No skull fracture. No acute intracranial process.  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   Electronically Signed   By: Awilda Metroourtnay  Bloomer   On: 03/24/2014 04:31     EKG Interpretation None      MDM   Final diagnoses:  None   Patient presents after  unwitnessed fall, she is nonverbal and unable to provide history. She does not appear to be in any acute distress. CT scan was obtained which does not show any acute injury. Only chronic left cerebral encephalomalacia. Her vital signs were within her normal limits and the patient is safe for discharge back to her nursing facility.   I personally performed the services described in this documentation, which was scribed in my presence. The recorded information has been reviewed and is accurate.    Tomasita CrumbleAdeleke Derricka Mertz, MD 03/24/14 [phone]

## 2014-04-04 ENCOUNTER — Non-Acute Institutional Stay (SKILLED_NURSING_FACILITY): Payer: Medicare Other | Admitting: Internal Medicine

## 2014-04-04 DIAGNOSIS — R569 Unspecified convulsions: Secondary | ICD-10-CM | POA: Diagnosis not present

## 2014-04-04 DIAGNOSIS — F0390 Unspecified dementia without behavioral disturbance: Secondary | ICD-10-CM | POA: Diagnosis not present

## 2014-04-04 DIAGNOSIS — I639 Cerebral infarction, unspecified: Secondary | ICD-10-CM

## 2014-04-04 DIAGNOSIS — I10 Essential (primary) hypertension: Secondary | ICD-10-CM

## 2014-04-05 LAB — CBC AND DIFFERENTIAL
HCT: 34 % — AB (ref 36–46)
Hemoglobin: 11.1 g/dL — AB (ref 12.0–16.0)
PLATELETS: 230 10*3/uL (ref 150–399)
WBC: 9.9 10*3/mL

## 2014-04-10 ENCOUNTER — Encounter: Payer: Self-pay | Admitting: Internal Medicine

## 2014-04-10 NOTE — Progress Notes (Signed)
Patient ID: Katie House, female   DOB: 1940/04/23, 74 y.o.   MRN: [id number]   This is a routine visit.  Level of care skilled.  Facility [facility].   Chief complaint-medical management of chronic issues including dementia-history CVA-seizure disorder-hypertension-chronic kidney disease.   History of present illness.  Patient is a 74 year old female with the above diagnoses- she has a history of severe dementia with history of CVA with right hemiparesis global aphasia visual deficits and dysphagia.   She has had a relative. If stability here-this summer she was sent to the ER with significantly elevated blood pressure and possible seizure-this was treated with clonidine she also received Keppra she does continue on Keppra and there is been no further seizure activity to my knowledge.--The pressures appear to be variable I got manually 172/80 also see 163/79 154/89  She does have a history of a VP shunt study during her brief hospitalization there did not show any change in that status.  In regards to blood pressure she is on Cozaar 100 mg a day-and Norvasc 5 mg a day--      Family medical social history has been reviewed per previous progress notes as well as history and physical on 01/29/2011.   Medications have been reviewed per MAR.   Review of systems essentially unobtainable secondary to patient's aphasic status please see history of present illness nursing staff has not noted any increased complaints of pain shortness of breath .    Physical exam.  Temperature 97.1 pulse 100 respirations 18 blood pressure variable as noted above .  In general this is a frail elderly female in no distress resting comfortably in bed.  Her skin is warm and dry she does appear to have a mouth droop.  Oropharynx was difficult to assess patient did not really open her mouth very wide.  Eyes she does appear to have bilateral cataracts but limited vision --exam was somewhat difficult today secondary patient  closing her eyelids quite tightly.  Chest is clear to auscultation with poor respiratory effort no labored breathing.  Heart is regular rate and rhythm with occasional irregular beats there is no lower extremity edema.  Abdomen is soft does not appear to be acutely tender-  with positive bowel sounds.  Muscle skeletal does have right-sided weakness with contracture of her right upper extremity wrist and hand this is baseline.  Neurologic again right-sided deficits as noted above she is a phasic.   Labs  03/10/2014.  BNP is less than 5.  02/17/2014.  Hemoglobin A1c 6.7.  02/14/2014.  WBC 10.6 hemoglobin 12.1 platelets 225.  Sodium 138 potassium 4.1 BUN 28 creatinine 1.2.  Glucose 194.  Liver function tests within normal limits.  Amylase 82-lipase 35  12/07/2013.  Sodium 139 potassium 4.5 BUN 21 creatinine 1.3  09/10/2013.  WBC 9.0 hemoglobin 11.2 platelets 221.  08/31/2013.  Liver function tests within normal limits except albumin of 3.4.  TSH-0.968 07/27/2013.  Sodium 142 potassium 4.6 BUN 25 creatinine 1.5.  07/08/2013.  WBC 7.7 hemoglobin 11.1 platelets 182.  Hemoglobin A1c 6.1.  Liver function tests within normal limits except albumin of 3.4.  May 18th 2015.  Sodium 140 potassium 4.5 BUN 21 creatinine 1.4 this appears to be relatively baseline.  Jan- 22nd 2015.  WBC 7.4 hemoglobin 10.7 platelets 182.  Liver function tests within normal limits except albumin of 3.3   Assessment and plan.  #1-dementia this appears to be quite advanced apparently however she is quite stable continue to monitor currently on  no medication.-   #2 hypertension this appears variable she is on losartan. As well as Norvasc --systolics do appear to be somewhat elevated we will increase her Norvasc to 10 mg a day and monitor this #3 history seizure disorder she continues on Keppra this has been stable. - #4 history of CVA continue supportive care she does receive tramadol once a  day for pain issues apparently this is helping.  #5-history of anemia this appears relatively stable .--Will update lab  #6 renal insufficiency baseline creatinine appears to run in the low mid ones-Will update metabolic panel #7-elevated glucose a metabolic panel--hemoglobin A1c has been satisfactory    [id number]         . .Marland Kitchen

## 2014-05-24 LAB — HM DIABETES FOOT EXAM

## 2014-06-08 LAB — BASIC METABOLIC PANEL
BUN: 26 mg/dL — AB (ref 4–21)
CREATININE: 1.2 mg/dL — AB (ref <=1.1)
GLUCOSE: 279 mg/dL
Potassium: 4.3 mmol/L (ref 3.4–5.3)
SODIUM: 135 mmol/L — AB (ref 137–147)

## 2014-06-16 ENCOUNTER — Encounter: Payer: Self-pay | Admitting: Internal Medicine

## 2014-06-16 ENCOUNTER — Non-Acute Institutional Stay (SKILLED_NURSING_FACILITY): Payer: Medicare Other | Admitting: Internal Medicine

## 2014-06-16 DIAGNOSIS — I10 Essential (primary) hypertension: Secondary | ICD-10-CM | POA: Diagnosis not present

## 2014-06-16 DIAGNOSIS — F0390 Unspecified dementia without behavioral disturbance: Secondary | ICD-10-CM

## 2014-06-16 DIAGNOSIS — N189 Chronic kidney disease, unspecified: Secondary | ICD-10-CM

## 2014-06-16 DIAGNOSIS — I639 Cerebral infarction, unspecified: Secondary | ICD-10-CM

## 2014-06-16 DIAGNOSIS — R739 Hyperglycemia, unspecified: Secondary | ICD-10-CM

## 2014-06-16 NOTE — Progress Notes (Signed)
Patient ID: Katie House, female   DOB: 02/15/1940, 74 y.o.   MRN: [id number]       This is a routine visit.  Level of care skilled.  Facility [facility].   Chief complaint-medical management of chronic issues including dementia-history CVA-seizure disorder-hypertension-chronic kidney disease Acute visit secondary to elevated glucose-with previous history apparently of diabetes type 2.   History of present illness.  Patient is a [age] year old female with the above diagnoses- she has a history of severe dementia with history of CVA with right hemiparesis global aphasia visual deficits and dysphagia Apparently she has a past history of diabetes type 2 -- been once on oral medications according to her daughter but these were discontinued I suspect secondary to stability.  Her hemoglobin A1c back in January was satisfactory at 6.7  However most recent metabolic panel last week shows that glucose was actually over 200 which is quite unusual for her-subsequent random CBG was actually elevated reading high-she did receive 7 units of NovoLog insulin in a did come down to the mid 500s with an additional dose of 6 units came down into the mid 400s  Her vital signs are stable actually she ate a very good supper and it appears her blood sugars back up over 500 we have given her an additional 7 units.  This will have to be monitored closely I suspect she's had a change here with her elevated blood sugars and will most likely need a routine medication but would like to get more readings here to assess this was discussed with Dr. Lyn HollingsheadAlexander via phone.   In regards to other issues .  She has had a relative. 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  In regards to blood pressure she is on Cozaar 100 mg a day-and Norvasc 5 mg a day--  Tonight she is resting in bed comfortably appears essentially at her baseline she is alert and somewhat resistant to exam which is not new      Family medical social history has been reviewed per previous progress notes as well as history and physical on 01/29/2011.   Medications have been reviewed per MAR.   Review of systems essentially unobtainable secondary to patient's aphasic status please see history of present illness nursing staff has not noted any increased complaints of pain shortness of breath Again elevated CBGs have been noted .    Physical exam.  Temperature 97.7 pulse 88 respirations 20 blood pressure 136/77 O2 saturation is in the 90s on room air .  In general this is a frail elderly female in no distress resting comfortably in bed.  Her skin is warm and dry she does appear to have a mouth droop.  Oropharynx was difficult to assess patient did not really open her mouth very wide.  Eyes she does appear to have bilateral cataracts but limited vision --exam was somewhat difficult today secondary patient closing her eyelids quite tightly. Apparently occasionally there is some erythema possible drainage from her left eye  Chest is clear to auscultation with poor respiratory effort no labored breathing.  Heart is regular rate  and rhythm with occasional irregular beats there is no lower extremity edema.  Abdomen is soft does not appear to be acutely tender-  with positive bowel sounds--it is somewhat obese.  Muscle skeletal does have right-sided weakness with contracture of her right upper extremity wrist and hand this is baseline.  Neurologic again right-sided deficits as noted above she is a phasic.   Labs  06/08/2014.  Sodium 135 potassium 4.3 BUN  26 creatinine 1.17 of note glucose is elevated at 279  Katie House 23rd 2016.  Sodium 139 potassium 4.1 BUN 24 creatinine 0.9 glucose 150.      03/10/2014.  BNP is less than 5.  02/17/2014.  Hemoglobin A1c 6.7.  02/14/2014.  WBC 10.6 hemoglobin 12.1 platelets 225.  Sodium 138 potassium 4.1 BUN 28 creatinine 1.2.  Glucose 194.  Liver function tests within normal limits.  Amylase 82-lipase 35  12/07/2013.  Sodium 139 potassium 4.5 BUN 21 creatinine 1.3  09/10/2013.  WBC 9.0 hemoglobin 11.2 platelets 221.  08/31/2013.  Liver function tests within normal limits except albumin of 3.4.  TSH-0.968 07/27/2013.  Sodium 142 potassium 4.6 BUN 25 creatinine 1.5.  07/08/2013.  WBC 7.7 hemoglobin 11.1 platelets 182.  Hemoglobin A1c 6.1.  Liver function tests within normal limits except albumin of 3.4.  May 18th 2015.  Sodium 140 potassium 4.5 BUN 21 creatinine 1.4 this appears to be relatively baseline.  Jan- 22nd 2015.  WBC 7.4 hemoglobin 10.7 platelets 182.  Liver function tests within normal limits except albumin of 3.3   Assessment and plan.  #1-dementia this appears to be quite advanced apparently however she is quite stable continue to monitor currently on no medication.-   #2  she is on losartan. As well as Norvasc -Norvasc was recently increased to 10 mg a day--blood pressures appear satisfactory most recently 135/80 and again today 136/77  #3 history seizure disorder she continues on Keppra this has been stable. - #4 history of CVA continue supportive care she does receive tramadol once a day for pain issues apparently this is helping.  #5-history of anemia this appears relatively stable .--Will update lab  #6 renal insufficiency baseline creatinine appears to run in the low mid ones-Will update metabolic panel #7-suspected diabetes type 2-this apparently has been stable for some time on no medication-- hemoglobin A1c earlier this year appear to be  satisfactory however it appears her glucose is significantly elevated-this is being treated with short acting insulin currently will again obtain CBGs before meals and at bedtime and continue a sliding scale for now-once we get more readings I suspect we will start a long-term agent.  This was discussed with Dr. Lyn HollingsheadAlexander via phone  Clinically she appears stable-.  Will update a CBC with differential as well as a metabolic panel tomorrow a.m.--Also obtain hemoglobin A1c.  [id number]-of note greater than 35 minutes spent assessing patient-discussing her status with nursing staff-assessing her and reassessing her reviewing her chart-and coordinating and formulating a plan of care-of note greater than 50% of time spent coordinating plan of care             . .Marland Kitchen

## 2014-06-22 ENCOUNTER — Encounter: Payer: Self-pay | Admitting: *Deleted

## 2014-06-23 ENCOUNTER — Encounter: Payer: Self-pay | Admitting: Internal Medicine

## 2014-06-23 ENCOUNTER — Non-Acute Institutional Stay (SKILLED_NURSING_FACILITY): Payer: Medicare Other | Admitting: Internal Medicine

## 2014-06-23 DIAGNOSIS — I10 Essential (primary) hypertension: Secondary | ICD-10-CM

## 2014-06-23 DIAGNOSIS — N289 Disorder of kidney and ureter, unspecified: Secondary | ICD-10-CM

## 2014-06-23 DIAGNOSIS — E119 Type 2 diabetes mellitus without complications: Secondary | ICD-10-CM | POA: Diagnosis not present

## 2014-06-23 NOTE — Progress Notes (Signed)
Patient ID: Katie House, female   DOB: 08/12/1940, 74 y.o.   MRN: [id number]       This is an acute visit.  Level of care skilled.  Facility [facility].   Chief complaint -Acute visit follow-up diabetes type 2 with elevated blood sugars.   History of present illness.  Patient is a 74 year old female  she has a history of severe dementia with history of CVA with right hemiparesis global aphasia visual deficits and dysphagia Apparently she has a past history of diabetes type 2 -- been once on oral medications according to her daughter but these were discontinued I suspect secondary to stability.  Her hemoglobin A1c back in January was satisfactory at 6.7    Howevera recent metabolic panel showed that glucose was actually over 200 which is quite unusual for her-subsequent random CBG was actually elevated reading high-s She was started on NovoLog sliding scale insulin-she is also now been started on Levemir 10 units daily at bedtime-this was started last week   She continues to be stable apparently is eating quite well.  Her sugars have moderated however they are still fairly elevated it appears in the morning runs in the 2-300'ss range-this appears to be the case throughout the day as well as with midday readings often in the 300s as well as at 4 PM --- at bedtime more in the higher 200s  An updated hemoglobin A1c shows it has risen to 9.9 .           Family medical social history has been reviewed per previous progress notes as well as history and physical on 01/29/2011.   [facility] Current [facility] Prescriptions on File Prior to Visit  Medication Sig Dispense Refill  . amLODipine (NORVASC) 5 MG tablet Take 10 mg by mouth daily. For HTN    . DULoxetine (CYMBALTA) 30 MG capsule Take 60 mg by mouth daily. For depression    . levETIRAcetam (KEPPRA) 500 MG tablet Take 500 mg by mouth 2 (two) times daily. For seizure disorder    . loratadine  (CLARITIN) 10 MG tablet Take 10 mg by mouth daily. For allergies    . LORazepam (ATIVAN) 1 MG tablet Take 1 mg by mouth. 1 tablet by mouth prior to dental procedure as directed by dental staff    . losartan (COZAAR) 100 MG tablet Take 100 mg by mouth daily. For HTN    . methylcellulose (ARTIFICIAL TEARS) 1 % ophthalmic solution Place 1 drop into both eyes 2 (two) times daily. For dry eyes    . mirtazapine (REMERON) 7.5 MG tablet Take 7.5 mg by mouth at bedtime. For depression    . polyethylene glycol powder (MIRALAX) powder Take 17 g by mouth daily. For constipation    . traMADol (ULTRAM) 50 MG tablet Take 100 mg by mouth 1 day or 1 dose. 1 tablet by mouth daily     No current facility-administered medications on file prior to visit.   . Of note she has been started on Levemir 10 units daily at bedtime.  Is also on NovoLog sliding scale insulin   Review of systems essentially unobtainable secondary to patient's aphasic status please see history of present illness nursing staff has not noted any increased complaints of pain shortness of breath Again elevated CBGs have been noted .    Physical exam.   She is afebrile pulse 100 respirations 18 blood pressure 149/96 .  In general this is a frail elderly female  in no distress currently sitting in her chair.  Her skin is warm and dry she does appear to have a mouth droop.  Oropharynx was difficult to assess patient did not really open her mouth very wide.  Eyes she does appear to have bilateral cataracts but limited vision -  Chest is clear to auscultation with poor respiratory effort no labored breathing.  Heart is regular rate and rhythm with occasional irregular beats there is no lower extremity edema.  Abdomen is soft does not appear to be acutely tender-  with positive bowel sounds--it is somewhat obese.  Muscle skeletal does have right-sided weakness with contracture of her right upper extremity wrist and hand this is baseline.    Neurologic again right-sided deficits as noted above she is a phasic.   Labs.  06/16/2013.  Hemoglobin A1c 9.9.  WBC 9.3 hemoglobin 11.0 platelets 232.  Sodium 139 potassium 4.9 CO2 26.8 BUN 30 creatinine 1.18 area  Liver function tests within normal limits except albumin 3.46.  Of note glucose on that metabolic panel was 400    06/08/2014.  Sodium 135 potassium 4.3 BUN 26 creatinine 1.17 of note glucose is elevated at 279  Feb- 23rd 2016.  Sodium 139 potassium 4.1 BUN 24 creatinine 0.9 glucose 150.      03/10/2014.  BNP is less than 5.  02/17/2014.  Hemoglobin A1c 6.7.  02/14/2014.  WBC 10.6 hemoglobin 12.1 platelets 225.  Sodium 138 potassium 4.1 BUN 28 creatinine 1.2.  Glucose 194.  Liver function tests within normal limits.  Amylase 82-lipase 35  12/07/2013.  Sodium 139 potassium 4.5 BUN 21 creatinine 1.3  09/10/2013.  WBC 9.0 hemoglobin 11.2 platelets 221.  08/31/2013.  Liver function tests within normal limits except albumin of 3.4.  TSH-0.968 07/27/2013.  Sodium 142 potassium 4.6 BUN 25 creatinine 1.5.  07/08/2013.  WBC 7.7 hemoglobin 11.1 platelets 182.  Hemoglobin A1c 6.1.  Liver function tests within normal limits except albumin of 3.4.  May 18th 2015.  Sodium 140 potassium 4.5 BUN 21 creatinine 1.4 this appears to be relatively baseline.  Jan- 22nd 2015.  WBC 7.4 hemoglobin 10.7 platelets 182.  Liver function tests within normal limits except albumin of 3.3   . Clinically she appears to be stable and at baseline.   Assessment and plan      Diabetes type 2-as noted above she is now on NovoLog sliding scale insulin and routine Levemir-will increase her Levemir secondary to still some elevation in her blood sugars although this appears to be moderating.--will increase to 15 units QHS  This was discussed with Dr. Lyn HollingsheadAlexander via phone  Renal insufficiency-this appears to be baseline previous labs with a creatinine of  1.18 BUN of 30.  #3 history of anemia this is stable as well with a hemoglobin of 11.  #4 history of hypertension-she continues on losartan as well as Norvasc-I note her systolic diastolic somewhat elevated from baseline today however it appears this is noconsistent I also see 125/75-at this point will monitor apparently she is sitting in the chair and is somewhat agitated per nursing since she would rather be in bed I suspect this may be contributing to it-- at this point will monitor.  [id number]  .                     Marland Kitchen. .Marland Kitchen

## 2014-08-01 ENCOUNTER — Non-Acute Institutional Stay (SKILLED_NURSING_FACILITY): Payer: Medicare Other | Admitting: Internal Medicine

## 2014-08-01 DIAGNOSIS — E119 Type 2 diabetes mellitus without complications: Secondary | ICD-10-CM

## 2014-08-01 DIAGNOSIS — I10 Essential (primary) hypertension: Secondary | ICD-10-CM | POA: Diagnosis not present

## 2014-08-01 DIAGNOSIS — F0390 Unspecified dementia without behavioral disturbance: Secondary | ICD-10-CM

## 2014-08-01 DIAGNOSIS — I6990 Unspecified sequelae of unspecified cerebrovascular disease: Secondary | ICD-10-CM

## 2014-08-01 NOTE — Progress Notes (Signed)
Patient ID: Katie House, female   DOB: 1940/09/09, 74 y.o.   MRN: [id number]        This is a routine visit.  Level of care skilled.  Facility [facility].   Chief complaint-medical management of chronic issues including dementia-history CVA-seizure disorder-hypertension-chronic kidney disease diabetes type 2. Acute visit secondary to cough   History of present illness.  Patient is a 74 year old female with the above diagnoses- she has a history of severe dementia with history of CVA with right hemiparesis global aphasia visual deficits and dysphagia Family has noted a cough recently although appears possibly to be a bit better today apparently when noted there is some mucoid production.  In regards to other issues Apparently she has a past history of diabetes type 2 -- been once on oral medications according to her daughter but these were discontinued I suspect secondary to stability.  Her hemoglobin A1c back in January was satisfactory at 6.7--however metabolic panel last month was concerning for glucose actually 200-subsequent CBGs also were elevated.  She has been started on Levemir insulin this is been titrated up to 15 units a day-she is also on NovoLog sliding scale blood sugars at this point appears to be under better control largely mid 100s to mid 200s will update a hemoglobin A1c at actually had been as high as 9.9 last month    In regards to other issues .  She has had a relative. 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  In regards to blood pressure she is on Cozaar 100 mg a  day-and Norvasc 5 mg a day--I do not see consistent elevations recently blood pressures 145/85-134/84-121/75 in this range  Tonight she is resting in bed comfortably appears essentially at her baseline she is alert and somewhat resistant to exam which is not new      Family medical social history has been reviewed per previous progress notes as well as history and physical on 01/29/2011.   Medications have been reviewed per [facility] This includes.  Valsartan 100 mg daily.  Cymbalta 60 mg daily.  Norvasc 10 mg daily.  Remeron 7.5 mg daily.  MiraLAX daily.  Levimer   15 units daily at bedtime.  NovoLog sliding scale insulin with meals.  Keppra 500 mg twice a day.   Review of systems essentially unobtainable secondary to patient's aphasic status please see history of present illness nursing staff has not noted any increased complaints of pain shortness of breath Family has noted an intermittent cough .    Physical exam.   Temperature 98.6 pulse 94 respirations 18 blood pressure 134/84 .  In general this is a frail elderly female in no distress resting comfortably in bed.  Her skin is warm and dry she does appear to have a mouth droop.  Oropharynx was difficult to assess patient did not really open her mouth very wide.  Eyes she does appear to have bilateral cataracts but limited vision --exam was somewhat difficult today secondary patient closing her eyelids quite tightly. Apparently occasionally there is some erythema possible drainage from her left eye --but I don't note  that tonight Chest is clear to auscultation with poor respiratory effort no labored breathing.  Heart is regular rate and rhythm with occasional irregular beats there is no lower extremity edema.  Abdomen is soft does not appear to be acutely tender-  with positive bowel sounds--it is somewhat obese.  Muscle skeletal does have right-sided weakness with contracture of her right upper extremity wrist and hand this  is baseline. She does have some  skin flakiness over heels but I do not note any open areas or sores  Neurologic again right-sided deficits as noted above she is a phasic.   Labs  06/17/2014.  WBC 9.3 hemoglobin 11.0 platelets 232.  Hemoglobin A1c 9.9.  Sodium 139 potassium 4.9 BUN 30 creatinine 1.18.  Liver function tests within normal limits  06/08/2014.  Sodium 135 potassium 4.3 BUN 26 creatinine 1.17 of note glucose is elevated at 279  Deborah 23rd 2016.  Sodium 139 potassium 4.1 BUN 24 creatinine 0.9 glucose 150.      03/10/2014.  BNP is less than 5.  02/17/2014.  Hemoglobin A1c 6.7.  02/14/2014.  WBC 10.6 hemoglobin 12.1 platelets 225.  Sodium 138 potassium 4.1 BUN 28 creatinine 1.2.  Glucose 194.  Liver function tests within normal limits.  Amylase 82-lipase 35  12/07/2013.  Sodium 139 potassium 4.5 BUN 21 creatinine 1.3  09/10/2013.  WBC 9.0 hemoglobin 11.2 platelets 221.  08/31/2013.  Liver function tests within normal limits except albumin of 3.4.  TSH-0.968 07/27/2013.  Sodium 142 potassium 4.6 BUN 25 creatinine 1.5.  07/08/2013.  WBC 7.7 hemoglobin 11.1 platelets 182.  Hemoglobin A1c 6.1.  Liver function tests within normal limits except albumin of 3.4.  May 18th 2015.  Sodium 140 potassium 4.5 BUN 21 creatinine 1.4 this appears to be relatively baseline.  Jan- 22nd 2015.  WBC 7.4 hemoglobin 10.7 platelets 182.  Liver function tests within normal limits except albumin of 3.3   Assessment and plan.    #1 coughs-apparently this is intermittent will check a chest x-ray also Robitussin 10 mL every 6 hours when necessary cough for 7 days and have her monitored closely with vital signs pulse ox every shift for 48 hours.   1-dementia this appears to be quite advanced apparently however she is quite stable continue to monitor currently on no medication.-   #3  HTN-- she is on losartan. As well as Norvasc -Norvasc was recently  increased to 10 mg a day--blood pressures appear satisfactory with some variability most recently 134/84-145/85-121/75 in this range  #4 history seizure disorder she continues on Keppra this has been stable.  - #5 history of CVA continue supportive care she does receive tramadol once a day for pain issues apparently this is helping .  #6-history of anemia this appears relatively stable .--Will update lab  #6 renal insufficiency baseline creatinine appears to run in the low mid ones-Will update metabolic panel  #7- Diabetes type 2-of note this has recently become a more challenging issue however she appears to be responding to the Levemir as well as NovoLog as needed at this point will continue to monitor once we get more readings suspect we may try routine NovoLog at meals but would like to ensure stability here-we will check a hemoglobin A1c  #8-foot issues-again I did not appreciate any wounds-will order arterial Dopplers however do see where we stand with blood flow to the area.  This was discussed with her daughter at bedside  .  [id number]              . Marland Kitchen

## 2014-08-07 ENCOUNTER — Encounter: Payer: Self-pay | Admitting: Internal Medicine

## 2014-08-29 ENCOUNTER — Non-Acute Institutional Stay (SKILLED_NURSING_FACILITY): Payer: Medicare Other | Admitting: Internal Medicine

## 2014-08-29 DIAGNOSIS — E119 Type 2 diabetes mellitus without complications: Secondary | ICD-10-CM

## 2014-08-29 DIAGNOSIS — I6990 Unspecified sequelae of unspecified cerebrovascular disease: Secondary | ICD-10-CM | POA: Diagnosis not present

## 2014-08-29 DIAGNOSIS — R5383 Other fatigue: Secondary | ICD-10-CM

## 2014-08-29 DIAGNOSIS — F0390 Unspecified dementia without behavioral disturbance: Secondary | ICD-10-CM

## 2014-08-29 DIAGNOSIS — I10 Essential (primary) hypertension: Secondary | ICD-10-CM | POA: Diagnosis not present

## 2014-08-29 NOTE — Progress Notes (Signed)
Patient ID: Katie House, female   DOB: 21-Feb-1940, 74 y.o.   MRN: [id number]         This is a routine visit.  Level of care skilled.  Facility AF.   Chief complaint-medical management of chronic issues including dementia-history CVA-seizure disorder-hypertension-chronic kidney disease diabetes type 2.   History of present illness.  Patient is a 74 year old female with the above diagnoses- she has a history of severe dementia with history of CVA with right hemiparesis global aphasia visual deficits and dysphagia  Yesterday family thought patient was somewhat more lethargic-today she appears to be at her baseline does not really talk but she is alert and when attempting to examine she was somewhat agitated which is her baseline  In regards to other issues Apparently she has a past history of diabetes type 2 -- been once on oral medications according to her daughter but these were discontinued I suspect secondary to stability.  Her hemoglobin A1c back in January was satisfactory at 6.7--however metabolic panel in May was concerning for glucose actually 200-subsequent CBGs also were elevated.  She has been started on Levemir insulin this is been titrated up to 15 units a day-she is also on NovoLog sliding scale blood sugars at this point appears to be under better control largely mid 100s to mid 200s- hemoglobin A1c at one point was over 9 it had come down to 8.1 as of last month   In regards to other issues .  She has had a relative. I stability here-this summer she was sent to the ER with significantly elevated blood pressure and possible seizure-this was treated with clonidine she also received Keppra she does continue on Keppra and there has been no further seizure activity to my knowledge.-- Her blood pressure is stable -   She does have a history of a VP shunt study during her brief hospitalization there did not show any change in that status.  In regards to blood pressure she  is on Cozaar 100 mg a day-and Norvasc 10 mg a day--I do not see consistent elevations recently 118/75-138/81-144/83   Tonight she is resting in bed comfortably appears essentially at her baseline she is alert and somewhat resistant to exam which is not new      Family medical social history has been reviewed per previous progress notes as well as history and physical on 01/29/2011.   Medications have been reviewed per [facility] This includes.  Valsartan 100 mg daily.  Cymbalta 60 mg daily.  Norvasc 10 mg daily.  Remeron 7.5 mg daily.  MiraLAX daily.  Levimer   15 units daily at bedtime.  NovoLog sliding scale insulin with meals.  Keppra 500 mg twice a day.   Review of systems essentially unobtainable secondary to patient's aphasic status please see history of present illness nursing staff has not noted any increased complaints of pain shortness of breath Family feel she was somewhat more lethargic yesterday-he appears more at her baseline today  .    Physical exam.  Temperature 98.3 pulse 96 respirations 18 blood pressure 118/75 O2 saturation 94% on room air  .  In general this is a frail elderly female in no distress resting comfortably in bed. --somewhat agitated with exam which is her baseline Her skin is warm and dry she does appear to have a mouth droop.  Oropharynx was difficult to assess patient did not really open her mouth very wide.  Eyes she does appear to have bilateral cataracts but limited vision --  exam was somewhat difficult today secondary patient closing her eyelids quite tightly. Apparently occasionally there is some erythema possible drainage from her left eye --but I don't note that this afternoon  Chest is clear to auscultation with poor respiratory effort no labored breathing.  Heart is regular rate and rhythm with occasional irregular beats there is no lower extremity edema.  Abdomen is soft does not appear to be acutely tender-  with positive bowel  sounds--it is somewhat obese GU cannot really appreciate any acute suprapubic tenderness or distention.  Muscle skeletal does have right-sided weakness with contracture of her right upper extremity wrist and hand this is baseline.--Moves her left upper extremity and reacts to invasive maneuvers She does have some  skin flakiness over heels but I do not note any open areas or sores  Neurologic again right-sided deficits as noted above she is a phasic.   Labs  08/02/2014.  WBC 8.3 hemoglobin 11.1 platelets 251.  Sodium 137 potassium 4.3 BUN 22 creatinine 1.2.  Hemoglobin A1c-8.1  06/17/2014.  WBC 9.3 hemoglobin 11.0 platelets 232.  Hemoglobin A1c 9.9.  Sodium 139 potassium 4.9 BUN 30 creatinine 1.18.  Liver function tests within normal limits  06/08/2014.  Sodium 135 potassium 4.3 BUN 26 creatinine 1.17 of note glucose is elevated at 279   23rd 2016.  Sodium 139 potassium 4.1 BUN 24 creatinine 0.9 glucose 150.      03/10/2014.  BNP is less than 5.  02/17/2014.  Hemoglobin A1c 6.7.  02/14/2014.  WBC 10.6 hemoglobin 12.1 platelets 225.  Sodium 138 potassium 4.1 BUN 28 creatinine 1.2.  Glucose 194.  Liver function tests within normal limits.  Amylase 82-lipase 35  12/07/2013.  Sodium 139 potassium 4.5 BUN 21 creatinine 1.3  09/10/2013.  WBC 9.0 hemoglobin 11.2 platelets 221.  08/31/2013.  Liver function tests within normal limits except albumin of 3.4.  TSH-0.968 07/27/2013.  Sodium 142 potassium 4.6 BUN 25 creatinine 1.5.  07/08/2013.  WBC 7.7 hemoglobin 11.1 platelets 182.  Hemoglobin A1c 6.1.  Liver function tests within normal limits except albumin of 3.4.  May 18th 2015.  Sodium 140 potassium 4.5 BUN 21 creatinine 1.4 this appears to be relatively baseline.  Jan- 22nd 2015.  WBC 7.4 hemoglobin 10.7 platelets 182.  Liver function tests within normal limits except albumin of 3.3   Assessment and plan.    #1  Possible  lethargy-patient appears to be at her baseline today Will check blood work including a CBC and CMP tomorrow as well as a TSH-also will check a UA C&S   2-dementia this appears to be quite advanced apparently however she is quite stable continue to monitor currently on no medication.-   #3  HTN-- she is on losartan. As well as Norvasc -Norvasc was recently increased to 10 mg a day--blood pressures appear satisfactory with some variability most recently 118/75   #4 history seizure disorder she continues on Keppra this has been stable.  - #5 history of CVA continue supportive care she does receive tramadol once a day for pain issues apparently this is helping .  #6-history of anemia this appears relatively stable .with hemoglobin of 11.1--Will update lab  #6 renal insufficiency baseline creatinine appears to run in the low mid ones-Will update metabolic panel  #7- Diabetes type 2-of note this has recently become a more challenging issue however she appears to be responding to the Levemir as well as NovoLog as needed at this point will continue to monitor--hemoglobin A1c at 8.1 late June  appears to be trending down --     .  [id number]              . .Marland Kitchen

## 2014-09-02 ENCOUNTER — Encounter: Payer: Self-pay | Admitting: Internal Medicine

## 2014-09-02 ENCOUNTER — Non-Acute Institutional Stay (SKILLED_NURSING_FACILITY): Payer: Medicare Other | Admitting: Internal Medicine

## 2014-09-02 DIAGNOSIS — R569 Unspecified convulsions: Secondary | ICD-10-CM | POA: Diagnosis not present

## 2014-09-02 DIAGNOSIS — I10 Essential (primary) hypertension: Secondary | ICD-10-CM | POA: Diagnosis not present

## 2014-09-02 DIAGNOSIS — F32A Depression, unspecified: Secondary | ICD-10-CM

## 2014-09-02 DIAGNOSIS — F329 Major depressive disorder, single episode, unspecified: Secondary | ICD-10-CM

## 2014-09-02 DIAGNOSIS — N3000 Acute cystitis without hematuria: Secondary | ICD-10-CM

## 2014-09-02 NOTE — Assessment & Plan Note (Signed)
Chronic; Remeron increased to 15 mg qHS;Plan - continue cymbalta 60 mg daily and increased dose of remeron at 15 mg qHS and monitor for improve or SE

## 2014-09-02 NOTE — Assessment & Plan Note (Signed)
Chronic and stable and no reported sizures; Plan - cnt kepttra 50  Gm BID

## 2014-09-02 NOTE — Assessment & Plan Note (Signed)
Controlled on current meds;Plan- cont losartan 100 mg daily and norvasc 10 mg daily

## 2014-09-02 NOTE — Progress Notes (Signed)
MRN: [id number] Name: Katie House  Sex: female Age: 74 y.o. DOB: [birth year]  [name] Facility/Room:311 Level Of Care: SNF Provider: Merrilee Seashore D Emergency Contacts: Extended Emergency Contact Information Primary Emergency Contact: Jeanann Lewandowsky [place] Mobile Phone: [phone] Relation: Daughter  Code Status:   Allergies: Latex  Chief Complaint  Patient presents with  . Acute Visit  . Medical Management of Chronic Issues    HPI: Patient is 74 y.o. female who is being seen for HTN, depression and seizure disorder and acute UTI. Re: UTI, urine was done for MS changes, no fever, no vomiting or nausea , no flank pain, noted on 7/18 initially.  Past Medical History  Diagnosis Date  . Dementia   . Diabetes mellitus   . High blood pressure   . Glaucoma   . Cataracts, both eyes   . Hyperlipemia   . Depression   . Constipation   . Anemia   . Stroke   . Dysphagia, unspecified(787.20)   . Dementia, unspecified, without behavioral disturbance     History reviewed. No pertinent past surgical history.    Medication List       This list is accurate as of: 09/02/14  2:24 PM.  Always use your most recent med list.               amLODipine 5 MG tablet  Commonly known as:  NORVASC  Take 10 mg by mouth daily. For HTN     DULoxetine 30 MG capsule  Commonly known as:  CYMBALTA  Take 60 mg by mouth daily. For depression     KEPPRA 500 MG tablet  Generic drug:  levETIRAcetam  Take 500 mg by mouth 2 (two) times daily. For seizure disorder     loratadine 10 MG tablet  Commonly known as:  CLARITIN  Take 10 mg by mouth daily. For allergies     LORazepam 1 MG tablet  Commonly known as:  ATIVAN  Take 1 mg by mouth. 1 tablet by mouth prior to dental procedure as directed by dental staff     losartan 100 MG tablet  Commonly known as:  COZAAR  Take 100 mg by mouth daily. For HTN     methylcellulose 1 % ophthalmic solution  Commonly known  as:  ARTIFICIAL TEARS  Place 1 drop into both eyes 2 (two) times daily. For dry eyes     MIRALAX powder  Generic drug:  polyethylene glycol powder  Take 17 g by mouth daily. For constipation     mirtazapine 7.5 MG tablet  Commonly known as:  REMERON  Take 7.5 mg by mouth at bedtime. For depression     traMADol 50 MG tablet  Commonly known as:  ULTRAM  Take 100 mg by mouth 1 day or 1 dose. 1 tablet by mouth daily        No orders of the defined types were placed in this encounter.    Immunization History  Administered Date(s) Administered  . Influenza Whole 11/25/2012  . PPD Test 02/08/2011    History  Substance Use Topics  . Smoking status: Never Smoker   . Smokeless tobacco: Not on file  . Alcohol Use: No    Review of Systems  DATA OBTAINED: from patient, nurse GENERAL:  no fevers, fatigue, appetite changes SKIN: No itching, rash HEENT: No complaint RESPIRATORY: No cough, wheezing, SOB CARDIAC: No chest pain, palpitations, lower extremity edema  GI: No abdominal pain, No N/V/D or constipation,  No heartburn or reflux  GU: No dysuria, frequency or urgency, or incontinence  MUSCULOSKELETAL: No unrelieved bone/joint pain NEUROLOGIC: No headache, dizziness, per nursing change in MS PSYCHIATRIC: No c/o anxiety  Filed Vitals:   09/02/14 1412  BP: 137/69  Pulse: 96  Temp: 97.3 F (36.3 C)  Resp: 18    Physical Exam  GENERAL APPEARANCE: Alert, min conversant,BF No acute distress  SKIN: No diaphoresis rash HEENT: Unremarkable RESPIRATORY: Breathing is even, unlabored. Lung sounds are clear   CARDIOVASCULAR: Heart RRR no murmurs, rubs or gallops. No peripheral edema  GASTROINTESTINAL: Abdomen is soft, non-tender, not distended w/ normal bowel sounds.  GENITOURINARY: Bladder non tender, not distended  MUSCULOSKELETAL: No abnormal joints or musculature NEUROLOGIC: Cranial nerves 2-12 grossly intact PSYCHIATRIC: odd affect, no behavioral issues  Patient Active  Problem List   Diagnosis Date Noted  . Diabetes type 2, controlled 06/23/2014  . Conjunctivitis 03/13/2014  . Abdominal pain 02/17/2014  . UTI (urinary tract infection) 08/29/2013  . Arrhythmia 08/29/2013  . VP (ventriculoperitoneal) shunt status 08/27/2013  . Abdominal pain, unspecified site 08/19/2013  . Pre-ulcerative calluses 03/23/2013  . Chronic kidney disease 11/27/2012  . 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 Unspecified disorder of kidney and ureter 11/03/2012  . Acute renal failure 09/17/2012  . Seizures 08/26/2012  . Anemia of other chronic disease 08/11/2012  . Essential hypertension, benign 06/24/2012  . Alzheimer's disease 06/24/2012  . 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 Unspecified constipation 06/24/2012  . Dementia   . Diabetes mellitus   . High blood pressure   . Glaucoma   . Cataracts, both eyes   . Hyperlipemia   . Depression   . Constipation   . Anemia   . Stroke   . Dysphagia, unspecified(787.20)   . Dementia without behavioral disturbance     CBC    Component Value Date/Time   WBC 9.9 04/05/2014   HGB 11.1* 04/05/2014   HCT 34* 04/05/2014   PLT 230 04/05/2014    CMP     Component Value Date/Time   NA 135* 06/08/2014   K 4.3 06/08/2014   BUN 26* 06/08/2014   CREATININE 1.2* 06/08/2014   AST 22 02/14/2014   ALT 48* 02/14/2014   ALKPHOS 73 02/14/2014    Assessment and Plan  Essential hypertension, benign Controlled on current meds;Plan- cont losartan 100 mg daily and norvasc 10 mg daily  Seizures Chronic and stable and no reported sizures; Plan - cnt kepttra 50  Gm BID  Depression Chronic; Remeron increased to 15 mg qHS;Plan - continue cymbalta 60 mg daily and increased dose of remeron at 15 mg qHS and monitor for improve or SE  UTI (urinary tract infection) > 100,000 strep viridans grew out no MIC as it is susceptible to PCN; Plan - amoxil 500 mg BID for 7 days    Margit Hanks,  MD

## 2014-09-02 NOTE — Assessment & Plan Note (Signed)
>   100,000 strep viridans grew out no MIC as it is susceptible to PCN; Plan - amoxil 500 mg BID for 7 days

## 2014-09-03 ENCOUNTER — Encounter: Payer: Self-pay | Admitting: Internal Medicine

## 2014-09-06 ENCOUNTER — Encounter: Payer: Self-pay | Admitting: Vascular Surgery

## 2014-09-06 ENCOUNTER — Other Ambulatory Visit: Payer: Self-pay | Admitting: *Deleted

## 2014-09-06 DIAGNOSIS — I739 Peripheral vascular disease, unspecified: Secondary | ICD-10-CM

## 2014-09-08 ENCOUNTER — Other Ambulatory Visit: Payer: Self-pay | Admitting: *Deleted

## 2014-09-08 MED ORDER — TRAMADOL HCL 50 MG PO TABS: ORAL_TABLET | ORAL | Status: DC

## 2014-09-08 NOTE — Telephone Encounter (Signed)
[facility] 

## 2014-09-29 ENCOUNTER — Encounter: Payer: Self-pay | Admitting: Vascular Surgery

## 2014-09-30 ENCOUNTER — Ambulatory Visit (HOSPITAL_COMMUNITY)
Admission: RE | Admit: 2014-09-30 | Discharge: 2014-09-30 | Disposition: A | Discharge status: Home / Self Care | Payer: Medicare Other | Source: Ambulatory Visit | Attending: Vascular Surgery | Admitting: Vascular Surgery

## 2014-09-30 ENCOUNTER — Encounter: Payer: Medicare Other | Admitting: Vascular Surgery

## 2014-09-30 DIAGNOSIS — I739 Peripheral vascular disease, unspecified: Secondary | ICD-10-CM

## 2014-10-20 ENCOUNTER — Non-Acute Institutional Stay (SKILLED_NURSING_FACILITY): Payer: Medicare Other | Admitting: Internal Medicine

## 2014-10-20 DIAGNOSIS — I10 Essential (primary) hypertension: Secondary | ICD-10-CM | POA: Diagnosis not present

## 2014-10-20 DIAGNOSIS — N189 Chronic kidney disease, unspecified: Secondary | ICD-10-CM

## 2014-10-20 DIAGNOSIS — F0391 Unspecified dementia with behavioral disturbance: Secondary | ICD-10-CM | POA: Diagnosis not present

## 2014-10-20 DIAGNOSIS — E119 Type 2 diabetes mellitus without complications: Secondary | ICD-10-CM | POA: Diagnosis not present

## 2014-10-20 DIAGNOSIS — F03918 Unspecified dementia, unspecified severity, with other behavioral disturbance: Secondary | ICD-10-CM

## 2014-10-20 NOTE — Progress Notes (Signed)
Patient ID: Katie House, female   DOB: 23-Nov-1940, 74 y.o.   MRN: [id number]          This is a routine visit.  Level of care skilled.  Facility AF.   Chief complaint-medical management of chronic issues including dementia-history CVA-seizure disorder-hypertension-chronic kidney disease diabetes type 2.   History of present illness.  Patient is a 74 year old female with the above diagnoses- she has a history of severe dementia with history of CVA with right hemiparesis global aphasia visual deficits and dysphagia   nursing over the weekend left a note about possibly some increased edema on her right lower extremity - nursing today does not really report any acute changes   In regards to other issues Apparently she has a past history of diabetes type 2 -- been once on oral medications according to her daughter but these were discontinued I suspect secondary to stability.  Her hemoglobin A1c back in January was satisfactory at 6.7--however metabolic panel in May was concerning for glucose actually 200-subsequent CBGs also were elevated.  She has been started on Levemir insulin this is been titrated up to 15 units a day-she is also on NovoLog sliding scale blood sugars at this point appears to be under better control largely mid 100s to mid 200s- hemoglobin A1c at one point was over 9 it had come down to 8.1  Most recently Will update this   In regards to other issues .  She has had a relative. I stability herelast year she was sent to the ER with significantly elevated blood pressure and possible seizure-this was treated with clonidine she also received Keppra she does continue on Keppra and there has been no further seizure activity to my knowledge.-- Her blood pressure is stable -  Most recently 128/70-135/82 there is some variability with systolics occasionally in the 150s but this is not consistent  She does have a history of a VP shunt study during her brief hospitalization  there did not show any change in that status.  In regards to blood pressure she is on Cozaar 100 mg a day-and Norvasc 10 mg a day--I do not see consistent elevations  Most recent blood pressures 128/78-135/80 to see occasional systolics in the 150s but this is not consistent   Tonight she is resting in bed comfortably appears essentially at her baseline she is alert and is slightly resistant to exam which is not new  An actually less so than on previous exams  I note the psychiatric services has started her on Lamictal which is being titrated up        Family medical social history has been reviewed per previous progress notes as well as history and physical on 01/29/2011.   Medications have been reviewed per [facility] This includes.  Losartan 100 mg daily.  Cymbalta 60 mg daily.  Norvasc 10 mg daily.  Remeron 7.5 mg daily.  MiraLAX daily.  Levimer   15 units daily at bedtime.  NovoLog sliding scale insulin with meals.  Keppra 500 mg twice a day.   Review of systems essentially unobtainable secondary to patient's aphasic status please see history of present illness nursing staff has not noted any increased complaints of pain shortness of breath   .    Physical exam.   temperature 98.5 pulse 90 respirations 18 blood pressure 128/78 .  In general this is a frail elderly female in no distress resting comfortably in bed. --slightly  agitated with exam which is her baseline Her  skin is warm and dry she does appear to have a mouth droop.  Oropharynx was difficult to assess patient did not really open her mouth very wide.  Eyes she does appear to have bilateral cataracts but limited vision --exam was somewhat difficult today secondary patient closing her eyelids quite tightly. Apparently occasionally there is some erythema possible drainage from her left eye --but I don't note that this afternoon  Chest is clear to auscultation with poor respiratory effort no labored breathing.    Heart is regular rate and rhythm with occasional irregular beats there is no lower extremity edema.  Abdomen is soft does not appear to be acutely tender-  with positive bowel sounds--it is somewhat obese GU cannot really appreciate any acute suprapubic tenderness or distention.  Muscle skeletal does have right-sided weakness with contracture of her right upper extremity wrist and hand this is baseline.--Moves her left upper extremity and reacts to invasive maneuvers  Neurologic again right-sided deficits as noted above she is a phasic   she has some quite mild right lower extremity edema with decrease pedal pulse there is not really significant pedal edema here.   Labs   08/30/2014.  Sodium 136 potassium 4.4 BUN 27 creatinine 1.3.  Albumin 3.4 alkaline phosphatase 110 otherwise liver function tests within normal limits.   TSH-1.19.   WBC 9.1 hemoglobin 10.7 platelets 268   08/02/2014.  WBC 8.3 hemoglobin 11.1 platelets 251.  Sodium 137 potassium 4.3 BUN 22 creatinine 1.2.  Hemoglobin A1c-8.1  06/17/2014.  WBC 9.3 hemoglobin 11.0 platelets 232.  Hemoglobin A1c 9.9.  Sodium 139 potassium 4.9 BUN 30 creatinine 1.18.  Liver function tests within normal limits  06/08/2014.  Sodium 135 potassium 4.3 BUN 26 creatinine 1.17 of note glucose is elevated at 279   23rd 2016.  Sodium 139 potassium 4.1 BUN 24 creatinine 0.9 glucose 150.      03/10/2014.  BNP is less than 5.  02/17/2014.  Hemoglobin A1c 6.7.  02/14/2014.  WBC 10.6 hemoglobin 12.1 platelets 225.  Sodium 138 potassium 4.1 BUN 28 creatinine 1.2.  Glucose 194.  Liver function tests within normal limits.  Amylase 82-lipase 35  12/07/2013.  Sodium 139 potassium 4.5 BUN 21 creatinine 1.3  09/10/2013.  WBC 9.0 hemoglobin 11.2 platelets 221.  08/31/2013.  Liver function tests within normal limits except albumin of 3.4.  TSH-0.968 07/27/2013.  Sodium 142 potassium 4.6 BUN 25 creatinine  1.5.  07/08/2013.  WBC 7.7 hemoglobin 11.1 platelets 182.  Hemoglobin A1c 6.1.  Liver function tests within normal limits except albumin of 3.4.  May 18th 2015.  Sodium 140 potassium 4.5 BUN 21 creatinine 1.4 this appears to be relatively baseline.  Jan- 22nd 2015.  WBC 7.4 hemoglobin 10.7 platelets 182.  Liver function tests within normal limits except albumin of 3.3   Assessment and plan.    #1    Right lower extremity edema this is fairly mild I'm not confident this is really much change from her baseline Will check a venous Doppler to rule out any DVT L however   2-dementia this appears to be quite advanced-- I note she has been seen by psychiatric services and started on the Lamictal this is being titrated up -  Agitation today appears to be decreased from previous exams -   #3  HTN-- she is on losartan. As well as Norvasc -Norvasc was recently increased to 10 mg a day--blood pressures appear satisfactory with some variability  Recently 128/785   #4 history seizure disorder  she continues on Keppra this has been stable.  - #5 history of CVA continue supportive care she does receive tramadol once a day for pain issues apparently this is helping .  #6-history of anemia this appears relatively stable .with hemoglobin of 10.7--Will update lab   #6 renal insufficiency baseline creatinine appears to run in the low mid ones-Will update metabolic panel  #7- Diabetes type 2-of note this has recently become a more challenging issue however she appears to be responding to the Levemir as well as NovoLog as needed at this point will continue to monitor--hemoglobin A1c at 8.1 late June appears to be trending down-- will update this   Again will update CBC CMP and hemoglobin A1c next lab day --     .  [id number]              . Marland Kitchen

## 2014-10-26 ENCOUNTER — Encounter: Payer: Self-pay | Admitting: Vascular Surgery

## 2014-10-28 ENCOUNTER — Encounter: Payer: Medicare Other | Admitting: Vascular Surgery

## 2014-10-31 ENCOUNTER — Non-Acute Institutional Stay (SKILLED_NURSING_FACILITY): Payer: Medicare Other | Admitting: Internal Medicine

## 2014-10-31 DIAGNOSIS — E119 Type 2 diabetes mellitus without complications: Secondary | ICD-10-CM

## 2014-10-31 DIAGNOSIS — I10 Essential (primary) hypertension: Secondary | ICD-10-CM | POA: Diagnosis not present

## 2014-11-01 ENCOUNTER — Encounter: Payer: Self-pay | Admitting: Internal Medicine

## 2014-11-01 NOTE — Progress Notes (Signed)
Patient ID: Katie House, female   DOB: September 03, 1940, 74 y.o.   MRN: [id number]       This is an acute visit.  Level of care skilled.  Facility AF.   Chief complaint-acute visit follow-up diabetes type 2-hypertension    History of present illness.  Patient is a 74 year old female with the above diagnoses- she has a history of severe dementia with history of CVA with right hemiparesis global aphasia visual deficits and dysphagia   nursing over the weekend left a note about possibly some increased blood pressure readings and CBGs.   . She is on Levemir 15 units daily at bedtime-this is a fairly recent addition-she had been on no medication for some time secondary to stability in fact Her hemoglobin A1c back in January was satisfactory at 6.7--however metabolic panel in May was concerning for glucose actually 200-subsequent CBGs also were elevated.  Shewas started on Levemir insulin thiswas titrated up to 15 units a day-she is also on NovoLog sliding scale her hemoglobin A1c was significantly elevated but is coming down it was 8.1 in June   Now 7.8 on lab done earlier this month.  I have reviewed her CBGs and there is some variability blood sugars in the morning appeared average more in the mid higher 100s at 11:30 appears to be largely in the lower 200s occasionally mid 200s-at 4:30 appears to be in the higher 100s to lower 200s-at bedtime blood sugars appear to be more in the 200s range fairly consistently.  In regards to her blood pressure she is on losartan 100 mg a day and Norvasc 10 mg a day Norvasc was fairly recently increased-blood pressures are quite variable I got 142/82 day manually-she gets frequent blood pressures it appears to range at times from the 140s systolically to the 1 teens systolically I do not really see consistent elevations-I do see one listed as 189/98 but this appears to be an outlier again there is variability but I do not see consistent elevations above 140       This afternoon   she is resting in bed comfortably appears essentially at her baseline she is alert          Family medical social history has been reviewed per previous progress notes as well as history and physical on 01/29/2011.   Medications have been reviewed per [facility] This includes.  Losartan 100 mg daily.  Cymbalta 60 mg daily.  Norvasc 10 mg daily.  Remeron 7.5 mg daily.  MiraLAX daily.  Levimer   15 units daily at bedtime.  NovoLog sliding scale insulin with meals.  Keppra 500 mg twice a day.   Review of systems essentially unobtainable secondary to patient's aphasic status please see history of present illness nursing staff has not noted any increased complaints of pain shortness of breath   .    Physical exam.  He is afebrile pulse 96 respirations 18 blood pressure 142/82 manually .   In general this is an elderly female in no distress resting comfortably in bed she is mildly resisting exam today Her skin is warm and dry she does appear to have a mouth droop.  Oropharynx was difficult to assess patient did not really open her mouth very wide.  Eyes she does appear to have bilateral cataracts but limited vision --exam was somewhat difficult to secondary patient closing her eyelids quite tightly   Chest is clear to auscultation with poor respiratory effort no labored breathing.  Heart is regular rate and  rhythm  with occasional irregular beats there is no lower extremity edema.  Abdomen is soft does not appear to be acutely tender-  with positive bowel sounds--it is somewhat obese  .  Muscle skeletal does have right-sided weakness with contracture of her right upper extremity wrist and hand this is baseline.--Moves her left upper extremity and reacts to invasive maneuvers  Neurologic again right-sided deficits as noted above she is a phasic      Labs  10/21/2014.  WBC 8.3 hemoglobin 10.8.  Sodium 137 potassium 4.6 BUN 29 creatinine 1.4 CO2  28.  Albumin 3.5.  Hemoglobin A1c 7.8   08/30/2014.  Sodium 136 potassium 4.4 BUN 27 creatinine 1.3.  Albumin 3.4 alkaline phosphatase 110 otherwise liver function tests within normal limits.   TSH-1.19.   WBC 9.1 hemoglobin 10.7 platelets 268   08/02/2014.  WBC 8.3 hemoglobin 11.1 platelets 251.  Sodium 137 potassium 4.3 BUN 22 creatinine 1.2.  Hemoglobin A1c-8.1  06/17/2014.  WBC 9.3 hemoglobin 11.0 platelets 232.  Hemoglobin A1c 9.9.  Sodium 139 potassium 4.9 BUN 30 creatinine 1.18.  Liver function tests within normal limits  06/08/2014.  Sodium 135 potassium 4.3 BUN 26 creatinine 1.17 of note glucose is elevated at 279   23rd 2016.  Sodium 139 potassium 4.1 BUN 24 creatinine 0.9 glucose 150.      03/10/2014.  BNP is less than 5.  02/17/2014.  Hemoglobin A1c 6.7.  02/14/2014.  WBC 10.6 hemoglobin 12.1 platelets 225.  Sodium 138 potassium 4.1 BUN 28 creatinine 1.2.  Glucose 194.  Liver function tests within normal limits.  Amylase 82-lipase 35  12/07/2013.  Sodium 139 potassium 4.5 BUN 21 creatinine 1.3  09/10/2013.  WBC 9.0 hemoglobin 11.2 platelets 221.    Liver function tests within normal limits except albumin of 3.3   Assessment and plan.   #1-diabetes type 2-again has been a significant issue since beginning of the year with elevated blood sugar readings discovered on routine labs-this has stabilized-she is on Levemir 15 units daily at bedtime still somewhat elevated although hemoglobin A1c is improving which is encouraging-will increase her Levemir slightly up to 17 units daily at bedtime and continue to monitor-she also continues on sliding scale for now.  As some point consider routine dosing before meals but would like to get a little more history here especially since we are increasing the Levemir.  This was discussed with Dr. Leanord Hawking via phone  -   #2  HTN-- she is on losartan. As well as Norvasc -Norvasc was  recently increased to 10 mg a day--blood pressures have some variability but I do not see consistent elevations systolically over 140 at this point will monitor    .  #3-history of anemia this appears relatively stable .with hemoglobin of 10.7 on lab done on September 10-2014   #4 renal insufficiency baseline creatinine appears to run in the low mid ones as recently 1.4 on lab done 10/21/2014  [id number] note greater than 30 minutes spent assessing patient reviewing her chart-discussing his status with nursing staff as well as with Dr. Leanord Hawking via phone-of note greater than 50% of time spent coordinating plan of care with chart-blood pressures-CBG review  --     .  [id number]              . Marland Kitchen

## 2014-11-08 ENCOUNTER — Non-Acute Institutional Stay (SKILLED_NURSING_FACILITY): Payer: Medicare Other | Admitting: Internal Medicine

## 2014-11-08 ENCOUNTER — Encounter: Payer: Self-pay | Admitting: Internal Medicine

## 2014-11-08 DIAGNOSIS — N3000 Acute cystitis without hematuria: Secondary | ICD-10-CM

## 2014-11-08 NOTE — Assessment & Plan Note (Addendum)
>   100,00 E Coli sensitive to ampicillin ; tx with amoxil 875 mg BID for 7 days with floraster 250 mg BID for 10 days

## 2014-11-08 NOTE — Progress Notes (Signed)
MRN: [id number] Name: Mara Favero  Sex: female Age: 74 y.o. DOB: 05/19/1940  [facility]: [name] Facility/Room:311 Level Of Care: SNF Provider: Merrilee Seashore D Emergency Contacts: Extended Emergency Contact Information Primary Emergency Contact: Jeanann Lewandowsky [place] Mobile Phone: [phone] Relation: Daughter  Code Status:   Allergies: Latex  Chief Complaint  Patient presents with  . Acute Visit    HPI: Patient is 74 y.o. female presenting with elevated BS and some sleepiness for several days. No fever, no cough no cold. No note of urinary sx or foul urine but u/a was obtained which was + for infection.  Past Medical History  Diagnosis Date  . Dementia   . Diabetes mellitus   . High blood pressure   . Glaucoma   . Cataracts, both eyes   . Hyperlipemia   . Depression   . Constipation   . Anemia   . Stroke   . Dysphagia, unspecified(787.20)   . Dementia, unspecified, without behavioral disturbance     History reviewed. No pertinent past surgical history.    Medication List       This list is accurate as of: 11/08/14  9:17 PM.  Always use your most recent med list.               amLODipine 5 MG tablet  Commonly known as:  NORVASC  Take 10 mg by mouth daily. For HTN     DULoxetine 30 MG capsule  Commonly known as:  CYMBALTA  Take 60 mg by mouth daily. For depression     KEPPRA 500 MG tablet  Generic drug:  levETIRAcetam  Take 500 mg by mouth 2 (two) times daily. For seizure disorder     lamoTRIgine 25 MG tablet  Commonly known as:  LAMICTAL  Take 25 mg by mouth daily. Lamictal 25 mg daily until September 14 at which time increased to 50 mg daily     loratadine 10 MG tablet  Commonly known as:  CLARITIN  Take 10 mg by mouth daily. For allergies     LORazepam 1 MG tablet  Commonly known as:  ATIVAN  Take 1 mg by mouth. 1 tablet by mouth prior to dental procedure as directed by dental staff     losartan 100 MG tablet  Commonly  known as:  COZAAR  Take 100 mg by mouth daily. For HTN     methylcellulose 1 % ophthalmic solution  Commonly known as:  ARTIFICIAL TEARS  Place 1 drop into both eyes 2 (two) times daily. For dry eyes     MIRALAX powder  Generic drug:  polyethylene glycol powder  Take 17 g by mouth daily. For constipation     mirtazapine 7.5 MG tablet  Commonly known as:  REMERON  Take 7.5 mg by mouth at bedtime. For depression     traMADol 50 MG tablet  Commonly known as:  ULTRAM  Take one tablet by mouth once daily for pain. Hold for sedation (control)        No orders of the defined types were placed in this encounter.    Immunization History  Administered Date(s) Administered  . Influenza Whole 11/25/2012  . PPD Test 02/08/2011    Social History  Substance Use Topics  . Smoking status: Never Smoker   . Smokeless tobacco: Not on file  . Alcohol Use: No    Review of Systems  DATA OBTAINED: from nurse GENERAL:  no fevers,+n fatigue, poor appetite  SKIN: No itching,  rash HEENT: No complaint RESPIRATORY: No cough, wheezing, SOB CARDIAC: No chest pain, palpitations, lower extremity edema  GI: No abdominal pain, No N/V/D or constipation, No heartburn or reflux  GU: No dysuria, frequency or urgency, or incontinence  MUSCULOSKELETAL: No unrelieved bone/joint pain NEUROLOGIC: No headache, dizziness  PSYCHIATRIC: No overt anxiety or sadness  Filed Vitals:   11/08/14 1608  BP: 132/74  Pulse: 97  Temp: 98.3 F (36.8 C)  Resp: 16    Physical Exam  GENERAL APPEARANCE: Alert, non conversant, No acute distress  SKIN: No diaphoresis rash HEENT: Unremarkable RESPIRATORY: Breathing is even, unlabored. Lung sounds are clear   CARDIOVASCULAR: Heart RRR no murmurs, rubs or gallops. No peripheral edema  GASTROINTESTINAL: Abdomen is soft, non-tender, not distended w/ normal bowel sounds.  GENITOURINARY: Bladder non tender, not distended  MUSCULOSKELETAL: No abnormal joints or  musculature NEUROLOGIC: Cranial nerves 2-12 grossly intact PSYCHIATRIC: dementia, no behavioral issues  Patient Active Problem List   Diagnosis Date Noted  . Diabetes type 2, controlled 06/23/2014  . Conjunctivitis 03/13/2014  . Abdominal pain 02/17/2014  . UTI (urinary tract infection) 08/29/2013  . Arrhythmia 08/29/2013  . VP (ventriculoperitoneal) shunt status 08/27/2013  . Abdominal pain, unspecified site 08/19/2013  . Pre-ulcerative calluses 03/23/2013  . Chronic kidney disease 11/27/2012  . 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 Unspecified disorder of kidney and ureter 11/03/2012  . Acute renal failure 09/17/2012  . Seizures 08/26/2012  . Anemia of other chronic disease 08/11/2012  . Essential hypertension, benign 06/24/2012  . Alzheimer's disease 06/24/2012  . 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 Unspecified constipation 06/24/2012  . Dementia   . Diabetes mellitus   . High blood pressure   . Glaucoma   . Cataracts, both eyes   . Hyperlipemia   . Depression   . Constipation   . Anemia   . Stroke   . Dysphagia, unspecified(787.20)   . Dementia without behavioral disturbance     CBC    Component Value Date/Time   WBC 9.9 04/05/2014   HGB 11.1* 04/05/2014   HCT 34* 04/05/2014   PLT 230 04/05/2014    CMP     Component Value Date/Time   NA 135* 06/08/2014   K 4.3 06/08/2014   BUN 26* 06/08/2014   CREATININE 1.2* 06/08/2014   AST 22 02/14/2014   ALT 48* 02/14/2014   ALKPHOS 73 02/14/2014    Assessment and Plan  UTI (urinary tract infection) > 100,00 E Coli   Time spent 25 min Margit Hanks, MD

## 2014-11-16 ENCOUNTER — Non-Acute Institutional Stay (SKILLED_NURSING_FACILITY): Payer: Medicare Other | Admitting: Internal Medicine

## 2014-11-16 ENCOUNTER — Encounter: Payer: Self-pay | Admitting: Internal Medicine

## 2014-11-16 DIAGNOSIS — I10 Essential (primary) hypertension: Secondary | ICD-10-CM

## 2014-11-16 NOTE — Assessment & Plan Note (Signed)
Pt's BP is back in 190-200 range frequently. Losartan and norvasc maxed out. Will start hydralazine 10 mg q 8, renal dose for calculated CrCl 39, for 5 days and on 10/10 increase to 25 mg q8. Did not use Bblocker 2/2 pulse 63 today.

## 2014-11-16 NOTE — Progress Notes (Signed)
MRN: [id number] Name: Katie House  Sex: female Age: 74 y.o. DOB: 05-16-1940  [facility] Facility/Room:311 Level Of Care: SNF Provider: Merrilee Seashore D Emergency Contacts: Extended Emergency Contact Information Primary Emergency Contact: Jeanann Lewandowsky [place] Mobile Phone: [phone] Relation: Daughter  Code Status:   Allergies: Latex  Chief Complaint  Patient presents with  . Acute Visit    HPI: Patient is 74 y.o. female who nursing asked me to see for elevated BP on multiple days on routine VS. Pt is not having HA, SOB, CP, Nausea or any other sx with the elevated BP.  Past Medical History  Diagnosis Date  . Dementia   . Diabetes mellitus (HCC)   . High blood pressure   . Glaucoma   . Cataracts, both eyes   . Hyperlipemia   . Depression   . Constipation   . Anemia   . Stroke (HCC)   . Dysphagia, unspecified(787.20)   . Dementia, unspecified, without behavioral disturbance     History reviewed. No pertinent past surgical history.    Medication List       This list is accurate as of: 11/16/14  2:20 PM.  Always use your most recent med list.               amLODipine 5 MG tablet  Commonly known as:  NORVASC  Take 10 mg by mouth daily. For HTN     DULoxetine 30 MG capsule  Commonly known as:  CYMBALTA  Take 60 mg by mouth daily. For depression     KEPPRA 500 MG tablet  Generic drug:  levETIRAcetam  Take 500 mg by mouth 2 (two) times daily. For seizure disorder     lamoTRIgine 25 MG tablet  Commonly known as:  LAMICTAL  Take 25 mg by mouth daily. Lamictal 25 mg daily until September 14 at which time increased to 50 mg daily     loratadine 10 MG tablet  Commonly known as:  CLARITIN  Take 10 mg by mouth daily. For allergies     LORazepam 1 MG tablet  Commonly known as:  ATIVAN  Take 1 mg by mouth. 1 tablet by mouth prior to dental procedure as directed by dental staff     losartan 100 MG tablet  Commonly known as:   COZAAR  Take 100 mg by mouth daily. For HTN     methylcellulose 1 % ophthalmic solution  Commonly known as:  ARTIFICIAL TEARS  Place 1 drop into both eyes 2 (two) times daily. For dry eyes     MIRALAX powder  Generic drug:  polyethylene glycol powder  Take 17 g by mouth daily. For constipation     mirtazapine 7.5 MG tablet  Commonly known as:  REMERON  Take 7.5 mg by mouth at bedtime. For depression     traMADol 50 MG tablet  Commonly known as:  ULTRAM  Take one tablet by mouth once daily for pain. Hold for sedation (control)        No orders of the defined types were placed in this encounter.    Immunization History  Administered Date(s) Administered  . Influenza Whole 11/25/2012  . PPD Test 02/08/2011    Social History  Substance Use Topics  . Smoking status: Never Smoker   . Smokeless tobacco: Not on file  . Alcohol Use: No    Review of Systems  DATA OBTAINED: from nurse, medical record GENERAL:  no fevers, fatigue, appetite changes SKIN: No  itching, rash HEENT: No complaint RESPIRATORY: No cough, wheezing, SOB CARDIAC: No chest pain, palpitations, lower extremity edema  GI: No abdominal pain, No N/V/D or constipation, No heartburn or reflux  GU: No dysuria, frequency or urgency, or incontinence  MUSCULOSKELETAL: No unrelieved bone/joint pain NEUROLOGIC: No headache, dizziness  PSYCHIATRIC: No overt anxiety or sadness  Filed Vitals:   11/16/14 1411  BP: 203/98  Pulse: 63  Temp: 97.3 F (36.3 C)  Resp: 18    Physical Exam  GENERAL APPEARANCE: Alert,min  conversant, BF,No acute distress  SKIN: No diaphoresis rash, or wounds HEENT: Unremarkable RESPIRATORY: Breathing is even, unlabored. Lung sounds are clear   CARDIOVASCULAR: Heart RRR no murmurs, rubs or gallops. No peripheral edema  GASTROINTESTINAL: Abdomen is soft, non-tender, not distended w/ normal bowel sounds.  GENITOURINARY: Bladder non tender, not distended  MUSCULOSKELETAL: No abnormal  joints or musculature NEUROLOGIC: Cranial nerves 2-12 grossly intact. PSYCHIATRIC: odd affect, no behavioral issues  Patient Active Problem List   Diagnosis Date Noted  . Diabetes type 2, controlled (HCC) 06/23/2014  . Conjunctivitis 03/13/2014  . Abdominal pain 02/17/2014  . UTI (urinary tract infection) 08/29/2013  . Arrhythmia 08/29/2013  . VP (ventriculoperitoneal) shunt status 08/27/2013  . Abdominal pain, unspecified site 08/19/2013  . Pre-ulcerative calluses 03/23/2013  . Chronic kidney disease 11/27/2012  . 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 Unspecified disorder of kidney and ureter 11/03/2012  . Acute renal failure (HCC) 09/17/2012  . Seizures (HCC) 08/26/2012  . Anemia of other chronic disease 08/11/2012  . Essential hypertension, benign 06/24/2012  . Alzheimer's disease 06/24/2012  . 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 Unspecified constipation 06/24/2012  . Dementia   . Diabetes mellitus (HCC)   . High blood pressure   . Glaucoma   . Cataracts, both eyes   . Hyperlipemia   . Depression   . Constipation   . Anemia   . Stroke (HCC)   . Dysphagia, unspecified(787.20)   . Dementia without behavioral disturbance     CBC    Component Value Date/Time   WBC 9.9 04/05/2014   HGB 11.1* 04/05/2014   HCT 34* 04/05/2014   PLT 230 04/05/2014    CMP     Component Value Date/Time   NA 135* 06/08/2014   K 4.3 06/08/2014   BUN 26* 06/08/2014   CREATININE 1.2* 06/08/2014   AST 22 02/14/2014   ALT 48* 02/14/2014   ALKPHOS 73 02/14/2014    Assessment and Plan  Essential hypertension, benign Pt's BP is back in 190-200 range frequently. Losartan and norvasc maxed out. Will start hydralazine 10 mg q 8, renal dose for calculated CrCl 39, for 5 days and on 10/10 increase to 25 mg q8. Did not use Bblocker 2/2 pulse 63 today.   Time spent 25 min;> 50% of time with patient was spent reviewing records, labs, tests and studies,  counseling and developing plan of care  Margit Hanks, MD

## 2014-11-17 ENCOUNTER — Encounter: Payer: Self-pay | Admitting: Internal Medicine

## 2014-11-17 ENCOUNTER — Non-Acute Institutional Stay (SKILLED_NURSING_FACILITY): Payer: Medicare Other | Admitting: Internal Medicine

## 2014-11-17 DIAGNOSIS — D72829 Elevated white blood cell count, unspecified: Secondary | ICD-10-CM

## 2014-11-17 DIAGNOSIS — I10 Essential (primary) hypertension: Secondary | ICD-10-CM | POA: Diagnosis not present

## 2014-11-17 DIAGNOSIS — J189 Pneumonia, unspecified organism: Secondary | ICD-10-CM | POA: Insufficient documentation

## 2014-11-17 NOTE — Progress Notes (Signed)
Patient ID: Katie House, female   DOB: 1940-06-08, 74 y.o.   MRN: [id number]        This is an acute visit.  Level of care skilled.  Facility AF.   Chief complaint-acute visit follow-up elevated white count    History of present illness.  Patient is a 74 year old female - she has a history of severe dementia with history of CVA with right hemiparesis global aphasia visual deficits and dysphagia    It appears her routine lab was ordered for updated values which is come back showing an elevated white count of 17.6 on October 5-appears to have elevated granulocytes-she is not running a fever and appears to be at her baseline there've been no reports of diarrhea or chest congestion or cough or overt dysuria although patient does have significant dementia and cannot really give any review of systems.  Of note she was seen by Dr. Lyn Hollingshead yesterday for elevated blood pressure and she was started on hydralazine 10 mg every 8 hours-she continues on losartan 100 mg a day as well as Norvasc 10 mg a day as well as her blood pressure today is 140/80          Family medical social history has been reviewed per previous progress notes as well as history and physical on 01/29/2011.   Medications have been reviewed per [facility] This includes.  Losartan 100 mg daily.  Cymbalta 60 mg daily.  Norvasc 10 mg daily.  Remeron 7.5 mg daily.  MiraLAX daily.  Levimer   15 units daily at bedtime.  NovoLog sliding scale insulin with meals.  Keppra 500 mg twice a day.  Hydralazine 10 mg every 8 hours   Review of systems essentially unobtainable secondary to patient's aphasic status please see history of present illness nursing staff has not noted any increased complaints of pain shortness of breath cough or congestion   .    Physical exam.   She is afebrile pulse 100 respirations 18 blood pressure 140/80.   In general this is an elderly female in no distress resting comfortably  Her skin  is warm and dry--I do not see any rashes or evidence of cellulitis Mouth-- she does appear to have a mouth droop.  Oropharynx was difficult to assess patient did not really open her mouth very wide.  Eyes she does appear to have bilateral cataracts but limited vision --exam was somewhat difficult to secondary patient closing her eyelids quite tightly   Chest is clear to auscultation with poor respiratory effort no labored breathing.  Heart is regular rate and rhythm borderline tachycardic at 100  with occasional irregular beats there is no lower extremity edema.  Abdomen is soft does not appear to be acutely tender-  with positive bowel sounds--it is somewhat obese  GU cannot really appreciate any suprapubic tenderness or discharge  .  Muscle skeletal does have right-sided weakness with contracture of her right upper extremity wrist and hand this is baseline.--Moves her left upper extremity  Neurologic again right-sided deficits as noted above she is a phasic--      Labs  11/16/2014.  The VBAC 17.6 hemoglobin 15.2 platelets 36.  Liver function tests albumin 3.8 ALT 12 otherwise liver function tests within normal limits.  Sodium 135 potassium 4.6 BUN 49 creatinine 1.0.    10/21/2014.  WBC 8.3 hemoglobin 10.8.  Sodium 137 potassium 4.6 BUN 29 creatinine 1.4 CO2 28.  Albumin 3.5.  Hemoglobin A1c 7.8   08/30/2014.  Sodium 136 potassium 4.4  BUN 27 creatinine 1.3.  Albumin 3.4 alkaline phosphatase 110 otherwise liver function tests within normal limits.   TSH-1.19.   WBC 9.1 hemoglobin 10.7 platelets 268   08/02/2014.  WBC 8.3 hemoglobin 11.1 platelets 251.  Sodium 137 potassium 4.3 BUN 22 creatinine 1.2.  Hemoglobin A1c-8.1  06/17/2014.  WBC 9.3 hemoglobin 11.0 platelets 232.  Hemoglobin A1c 9.9.  Sodium 139 potassium 4.9 BUN 30 creatinine 1.18.  Liver function tests within normal limits  06/08/2014.  Sodium 135 potassium 4.3 BUN 26 creatinine 1.17 of  note glucose is elevated at 279   23rd 2016.  Sodium 139 potassium 4.1 BUN 24 creatinine 0.9 glucose 150.      03/10/2014.  BNP is less than 5.  02/17/2014.  Hemoglobin A1c 6.7.  02/14/2014.  WBC 10.6 hemoglobin 12.1 platelets 225.  Sodium 138 potassium 4.1 BUN 28 creatinine 1.2.  Glucose 194.  Liver function tests within normal limits.  Amylase 82-lipase 35  12/07/2013.  Sodium 139 potassium 4.5 BUN 21 creatinine 1.3  09/10/2013.  WBC 9.0 hemoglobin 11.2 platelets 221.    Liver function tests within normal limits except albumin of 3.3   Assessment and plan.   #1-leukocytosis unclear etiology-this could be a lab variation will update a CBC with differential-also will order a chest x-ray since she is at risk for aspiration as well as a UA CNS will await those results she does not certainly appear to be septic at this time -- this will have to be monitored Physical exam appear to be benign-I did recheck her later in the day she appeared to be stable clinically with no overt signs of sepsis   -   #2  HTN-- she is on losartan. As well as Norvasc and hydralazine was added yesterday-blood pressure appears relatively stable at 140/80 since hydralazine was just started will continue to monitor  She continues to be borderline tachycardic at times but this is not new on recheck later today her pulse was in the 90s  [id number].        --     .  [id number]              . Marland Kitchen

## 2014-11-28 ENCOUNTER — Non-Acute Institutional Stay (SKILLED_NURSING_FACILITY): Payer: Medicare Other | Admitting: Internal Medicine

## 2014-11-28 ENCOUNTER — Inpatient Hospital Stay (HOSPITAL_COMMUNITY)
Admission: EM | Admit: 2014-11-28 | Discharge: 2014-12-03 | DRG: 071 | Disposition: A | Discharge status: Skilled Nursing Facility | Payer: Medicare Other | Source: Skilled Nursing Facility | Attending: Internal Medicine | Admitting: Internal Medicine

## 2014-11-28 ENCOUNTER — Emergency Department (HOSPITAL_COMMUNITY): Payer: Medicare Other

## 2014-11-28 ENCOUNTER — Encounter (HOSPITAL_COMMUNITY): Payer: Self-pay | Admitting: Emergency Medicine

## 2014-11-28 DIAGNOSIS — R400 Somnolence: Secondary | ICD-10-CM | POA: Diagnosis not present

## 2014-11-28 DIAGNOSIS — G40909 Epilepsy, unspecified, not intractable, without status epilepticus: Secondary | ICD-10-CM | POA: Diagnosis present

## 2014-11-28 DIAGNOSIS — R4182 Altered mental status, unspecified: Secondary | ICD-10-CM

## 2014-11-28 DIAGNOSIS — Z95828 Presence of other vascular implants and grafts: Secondary | ICD-10-CM

## 2014-11-28 DIAGNOSIS — F0390 Unspecified dementia without behavioral disturbance: Secondary | ICD-10-CM | POA: Diagnosis present

## 2014-11-28 DIAGNOSIS — I10 Essential (primary) hypertension: Secondary | ICD-10-CM | POA: Diagnosis present

## 2014-11-28 DIAGNOSIS — I6990 Unspecified sequelae of unspecified cerebrovascular disease: Secondary | ICD-10-CM

## 2014-11-28 DIAGNOSIS — Z794 Long term (current) use of insulin: Secondary | ICD-10-CM

## 2014-11-28 DIAGNOSIS — Z7401 Bed confinement status: Secondary | ICD-10-CM

## 2014-11-28 DIAGNOSIS — F0280 Dementia in other diseases classified elsewhere without behavioral disturbance: Secondary | ICD-10-CM | POA: Diagnosis present

## 2014-11-28 DIAGNOSIS — E1136 Type 2 diabetes mellitus with diabetic cataract: Secondary | ICD-10-CM | POA: Diagnosis present

## 2014-11-28 DIAGNOSIS — H540 Blindness, both eyes: Secondary | ICD-10-CM | POA: Diagnosis present

## 2014-11-28 DIAGNOSIS — G9340 Encephalopathy, unspecified: Secondary | ICD-10-CM | POA: Diagnosis present

## 2014-11-28 DIAGNOSIS — E119 Type 2 diabetes mellitus without complications: Secondary | ICD-10-CM

## 2014-11-28 DIAGNOSIS — H269 Unspecified cataract: Secondary | ICD-10-CM | POA: Diagnosis present

## 2014-11-28 DIAGNOSIS — F0391 Unspecified dementia with behavioral disturbance: Secondary | ICD-10-CM

## 2014-11-28 DIAGNOSIS — I69398 Other sequelae of cerebral infarction: Secondary | ICD-10-CM

## 2014-11-28 DIAGNOSIS — F32A Depression, unspecified: Secondary | ICD-10-CM | POA: Diagnosis present

## 2014-11-28 DIAGNOSIS — I69320 Aphasia following cerebral infarction: Secondary | ICD-10-CM

## 2014-11-28 DIAGNOSIS — F329 Major depressive disorder, single episode, unspecified: Secondary | ICD-10-CM | POA: Diagnosis present

## 2014-11-28 DIAGNOSIS — H409 Unspecified glaucoma: Secondary | ICD-10-CM | POA: Diagnosis present

## 2014-11-28 DIAGNOSIS — R252 Cramp and spasm: Secondary | ICD-10-CM | POA: Diagnosis present

## 2014-11-28 DIAGNOSIS — R1310 Dysphagia, unspecified: Secondary | ICD-10-CM | POA: Diagnosis present

## 2014-11-28 DIAGNOSIS — G309 Alzheimer's disease, unspecified: Secondary | ICD-10-CM | POA: Diagnosis present

## 2014-11-28 DIAGNOSIS — Z87440 Personal history of urinary (tract) infections: Secondary | ICD-10-CM

## 2014-11-28 DIAGNOSIS — I69351 Hemiplegia and hemiparesis following cerebral infarction affecting right dominant side: Secondary | ICD-10-CM

## 2014-11-28 DIAGNOSIS — E1139 Type 2 diabetes mellitus with other diabetic ophthalmic complication: Secondary | ICD-10-CM | POA: Diagnosis present

## 2014-11-28 DIAGNOSIS — Z8701 Personal history of pneumonia (recurrent): Secondary | ICD-10-CM

## 2014-11-28 DIAGNOSIS — E785 Hyperlipidemia, unspecified: Secondary | ICD-10-CM | POA: Diagnosis present

## 2014-11-28 LAB — COMPREHENSIVE METABOLIC PANEL
ALBUMIN: 3.8 g/dL (ref 3.5–5.0)
ALT: 28 U/L (ref 14–54)
ANION GAP: 10 (ref 5–15)
AST: 20 U/L (ref 15–41)
Alkaline Phosphatase: 85 U/L (ref 38–126)
BILIRUBIN TOTAL: 0.7 mg/dL (ref 0.3–1.2)
BUN: 30 mg/dL — ABNORMAL HIGH (ref 6–20)
CHLORIDE: 107 mmol/L (ref 101–111)
CO2: 21 mmol/L — ABNORMAL LOW (ref 22–32)
Calcium: 9.8 mg/dL (ref 8.9–10.3)
Creatinine, Ser: 1.37 mg/dL — ABNORMAL HIGH (ref 0.44–1.00)
GFR calc Af Amer: 43 mL/min — ABNORMAL LOW (ref >60)
GFR, EST NON AFRICAN AMERICAN: 37 mL/min — AB (ref >60)
Glucose, Bld: 148 mg/dL — ABNORMAL HIGH (ref 65–99)
POTASSIUM: 4.6 mmol/L (ref 3.5–5.1)
Sodium: 138 mmol/L (ref 135–145)
TOTAL PROTEIN: 7.4 g/dL (ref 6.5–8.1)

## 2014-11-28 LAB — I-STAT TROPONIN, ED: Troponin i, poc: 0.01 ng/mL (ref 0.00–0.08)

## 2014-11-28 LAB — URINALYSIS, ROUTINE W REFLEX MICROSCOPIC
BILIRUBIN URINE: NEGATIVE
GLUCOSE, UA: NEGATIVE mg/dL
Hgb urine dipstick: NEGATIVE
KETONES UR: NEGATIVE mg/dL
Leukocytes, UA: NEGATIVE
Nitrite: NEGATIVE
PH: 5.5 (ref 5.0–8.0)
Protein, ur: NEGATIVE mg/dL
SPECIFIC GRAVITY, URINE: 1.018 (ref 1.005–1.030)
Urobilinogen, UA: 0.2 mg/dL (ref 0.0–1.0)

## 2014-11-28 LAB — CBC
HCT: 38.9 % (ref 36.0–46.0)
Hemoglobin: 12.4 g/dL (ref 12.0–15.0)
MCH: 27.3 pg (ref 26.0–34.0)
MCHC: 31.9 g/dL (ref 30.0–36.0)
MCV: 85.7 fL (ref 78.0–100.0)
PLATELETS: 262 10*3/uL (ref 150–400)
RBC: 4.54 MIL/uL (ref 3.87–5.11)
RDW: 15.9 % — AB (ref 11.5–15.5)
WBC: 10.3 10*3/uL (ref 4.0–10.5)

## 2014-11-28 LAB — I-STAT CG4 LACTIC ACID, ED: Lactic Acid, Venous: 1.54 mmol/L (ref 0.5–2.0)

## 2014-11-28 LAB — CBG MONITORING, ED: GLUCOSE-CAPILLARY: 138 mg/dL — AB (ref 65–99)

## 2014-11-28 MED ORDER — SODIUM CHLORIDE 0.9 % IV BOLUS (SEPSIS)
500.0000 mL | Freq: Once | INTRAVENOUS | Status: AC
  Administered 2014-11-28: 500 mL via INTRAVENOUS

## 2014-11-28 NOTE — ED Provider Notes (Signed)
CSN: [id number]     Arrival date & time 11/28/14  1914 History   First MD Initiated Contact with Patient 11/28/14 1923     Chief Complaint  Patient presents with  . Altered Mental Status     (Consider location/radiation/quality/duration/timing/severity/associated sxs/prior Treatment) HPI..... Level V caveat for dementia. Patient allegedly has been treated for ? UTI and pneumonia at the nursing home. She is bedridden. Family reports increased lethargy, not following commands, only responding to painful stimuli.  Patient has multiple health problems including dementia, previous stroke  Past Medical History  Diagnosis Date  . Dementia   . Diabetes mellitus (HCC)   . High blood pressure   . Glaucoma   . Cataracts, both eyes   . Hyperlipemia   . Depression   . Constipation   . Anemia   . Stroke (HCC)   . Dysphagia, unspecified(787.20)   . Dementia, unspecified, without behavioral disturbance    History reviewed. No pertinent past surgical history. History reviewed. No pertinent family history. Social History  Substance Use Topics  . Smoking status: Never Smoker   . Smokeless tobacco: None  . Alcohol Use: No   OB History    No data available     Review of Systems  Reason unable to perform ROS: dmentia.      Allergies  Latex  Home Medications   Prior to Admission medications   Medication Sig Start Date End Date Taking? Authorizing Provider  amLODipine (NORVASC) 10 MG tablet Take 10 mg by mouth daily.   Yes Historical Provider, MD  amoxicillin (AMOXIL) 875 MG tablet Take 875 mg by mouth 2 (two) times daily. Take for 7 days Start date 11-08-14   Yes Historical Provider, MD  DULoxetine (CYMBALTA) 60 MG capsule Take 60 mg by mouth daily.   Yes Historical Provider, MD  hydrALAZINE (APRESOLINE) 10 MG tablet Take 10 mg by mouth 3 (three) times daily.   Yes Historical Provider, MD  insulin aspart (NOVOLOG) 100 UNIT/ML injection Inject 2-8 Units into the skin 3 (three) times  daily before meals. 61-200=0 units, 201-250=2 units, 251-300=4 units, 301-350=6 units, 351-400=8 units if above 400 give 8 units and notify provider.   Yes Historical Provider, MD  insulin detemir (LEVEMIR) 100 UNIT/ML injection Inject 17 Units into the skin at bedtime.   Yes Historical Provider, MD  lamoTRIgine (LAMICTAL) 150 MG tablet Take 150 mg by mouth daily. Notify provider immediately if patient develops rash   Yes Historical Provider, MD  levETIRAcetam (KEPPRA) 500 MG tablet Take 500 mg by mouth 2 (two) times daily. For seizure disorder   Yes Historical Provider, MD  loratadine (CLARITIN) 10 MG tablet Take 10 mg by mouth daily. For allergies   Yes Historical Provider, MD  losartan (COZAAR) 100 MG tablet Take 100 mg by mouth daily. For HTN   Yes Historical Provider, MD  methylcellulose (ARTIFICIAL TEARS) 1 % ophthalmic solution Place 1 drop into both eyes 2 (two) times daily. For dry eyes   Yes Historical Provider, MD  mirtazapine (REMERON) 15 MG tablet Take 15 mg by mouth at bedtime.   Yes Historical Provider, MD  polyethylene glycol powder (MIRALAX) powder Take 17 g by mouth daily. For constipation   Yes Historical Provider, MD  saccharomyces boulardii (FLORASTOR) 250 MG capsule Take 250 mg by mouth 2 (two) times daily. Take for 10 days start date on 11-08-14   Yes Historical Provider, MD  traMADol (ULTRAM) 50 MG tablet Take 50 mg by mouth daily.   Yes  Historical Provider, MD  traMADol (ULTRAM) 50 MG tablet Take one tablet by mouth once daily for pain. Hold for sedation (control) Patient not taking: Reported on 11/28/2014 09/08/14   Sharon Seller, NP   BP 141/59 mmHg  Pulse 46  Temp(Src) 98.4 F (36.9 C) (Rectal)  Resp 17  SpO2 100% Physical Exam  Constitutional:  demented  HENT:  Head: Normocephalic and atraumatic.  Eyes: Conjunctivae and EOM are normal. Pupils are equal, round, and reactive to light.  Neck: Normal range of motion. Neck supple.  Cardiovascular: Normal rate and  regular rhythm.   Pulmonary/Chest: Effort normal and breath sounds normal.  Abdominal: Soft. Bowel sounds are normal.  Musculoskeletal:  unable  Neurological:  unable  Skin: Skin is warm and dry.  Psychiatric:  unable  Nursing note and vitals reviewed.   ED Course  Procedures (including critical care time) Labs Review Labs Reviewed  COMPREHENSIVE METABOLIC PANEL - Abnormal; Notable for the following:    CO2 21 (*)    Glucose, Bld 148 (*)    BUN 30 (*)    Creatinine, Ser 1.37 (*)    GFR calc non Af Amer 37 (*)    GFR calc Af Amer 43 (*)    All other components within normal limits  CBC - Abnormal; Notable for the following:    RDW 15.9 (*)    All other components within normal limits  CBG MONITORING, ED - Abnormal; Notable for the following:    Glucose-Capillary 138 (*)    All other components within normal limits  URINALYSIS, ROUTINE W REFLEX MICROSCOPIC (NOT AT [facility])  I-STAT TROPOININ, ED  I-STAT CG4 LACTIC ACID, ED  I-STAT CG4 LACTIC ACID, ED    Imaging Review Ct Head Wo Contrast  11/28/2014  CLINICAL DATA:  74 year old female with a history of altered mental status, increased lethargy. Prior stroke. EXAM: CT HEAD WITHOUT CONTRAST TECHNIQUE: Contiguous axial images were obtained from the base of the skull through the vertex without intravenous contrast. COMPARISON:  CT 03/24/2014 FINDINGS: Unremarkable appearance of the calvarium without acute fracture or aggressive lesion. Unremarkable appearance of the scalp soft tissues. Unremarkable appearance of the bilateral orbits. Persistent dislocation of the left mandibular condyles. Mastoid air cells are clear. No significant paranasal sinus disease Re- demonstration of extensive encephalomalacia of the left hemisphere. Ex vacuo dilation of the left ventricular system. Intracranial stent of the proximal left MCA. No acute intracranial hemorrhage. No midline shift. No mass effect. Gray-white differentiation of the posterior fossa  and the right cerebral hemisphere relatively maintained. Hypodensity in the right-sided periventricular white matter. IMPRESSION: No CT evidence of acute intracranial abnormality. Re- demonstration of extensive encephalomalacia of the left hemisphere with stent in the proximal left MCA. Signed, Yvone Neu. Loreta Ave, DO [facility] Electronically Signed   By: Gilmer Mor D.O.   On: 11/28/2014 21:37   Dg Chest Portable 1 View  11/28/2014  CLINICAL DATA:  Altered mental status, diabetes mellitus, hypertension, prior stroke EXAM: PORTABLE CHEST 1 VIEW COMPARISON:  Portable exam 2023 hours without priors for comparison. FINDINGS: Patient's RIGHT arm is contracted and obscures the RIGHT lung base. Patient's chin obscures the RIGHT apex. Normal heart size, mediastinal contours and pulmonary vascularity. Visualized lungs clear. No gross pleural effusion or pneumothorax. IMPRESSION: No definite acute abnormalities within limitations of exam. Electronically Signed   By: Ulyses Southward M.D.   On: 11/28/2014 20:33   I have personally reviewed and evaluated these images and lab  results as part of my medical decision-making.   EKG Interpretation   Date/Time:  Monday November 28 2014 19:25:38 EDT Ventricular Rate:  91 PR Interval:  150 QRS Duration: 74 QT Interval:  344 QTC Calculation: 423 R Axis:   3 Text Interpretation:  Sinus rhythm Supraventricular bigeminy LAE, consider  biatrial enlargement Confirmed by Adriana SimasOOK  MD, Jolie Strohecker ([id number]) on 11/28/2014  8:25:15 PM      MDM   Final diagnoses:  Altered mental status, unspecified altered mental status type  Dementia, with behavioral disturbance    Family reports altered mental status. Workup in the emergency department was unfruitful for etiology. Admit to general medicine    Donnetta HutchingBrian Kandi Brusseau, MD 11/28/14 2342

## 2014-11-28 NOTE — ED Notes (Signed)
MD at bedside. 

## 2014-11-28 NOTE — ED Notes (Signed)
Pt was given amoxicillin 11/08/14 -11/15/14 for UTI and bactrim from 10/6-10/13 for ? PNA.

## 2014-11-28 NOTE — ED Notes (Signed)
Pt here from [facility] via EMS- staff reports altered mental status, increased lethargy. Pt will not follow commands at this time and only responds to painful stimuli. Hx previous stroke with right paralysis. Vitals WNL- NAD.

## 2014-11-28 NOTE — Progress Notes (Signed)
Patient ID: Katie House, female   DOB: 04-30-1940, 74 y.o.   MRN: [id number]         This is an acute visit.  Level of care skilled.  Facility AF.   Chief complaint-acute visit secondary to unresponsiveness    History of present illness.  Patient is a 74 year old female - she has a history of severe dementia with history of CVA with right hemiparesis global aphasia visual deficits and dysphagia  Nursing staff this afternoon has noted some decreased responsiveness she is not eating and drinking a responding like she normally does according to her nursing tech-vital signs appear to be stable she is a type II diabetic on Levemir in her CBG is unremarkable as well                Family medical social history has been reviewed per previous progress notes as well as history and physical on 01/29/2011.   Medications have been reviewed per [facility] This includes.  Losartan 100 mg daily.  Cymbalta 60 mg daily.  Norvasc 10 mg daily.  Remeron 7.5 mg daily.  MiraLAX daily.  Levimer   15 units daily at bedtime.  NovoLog sliding scale insulin with meals.  Keppra 500 mg twice a day.  Hydralazine 10 mg every 8 hours   Review of systems essentially unobtainable patient is a phasic.  Nursing staff reports she has been at her baseline recently eating and drinking fairly well responsive does have a history of right-sided hemiparalysis but there has been no recent changes in her behavior no reports of increased shortness of breath cough or fever    .    Physical exam.   She is afebrile pulse of 100 respirations 18 blood pressure and CBG show stability.   In general this is an elderly female in no distress but does appear to be lethargic she actually is moving her left arm at times and rubbing her eye--she does respond somewhat to the sternal rub she is at baseline nonverbal  Her skin is warm and dry--I do not see any rashes or evidence of cellulitis Mouth-- she does appear to  have a mouth droop.  Oropharynx was difficult to assess patient did not really open her mouth very wide.  Eyes she does appear to have bilateral cataracts but limited vision   Chest is clear to auscultation with poor respiratory effort no labored breathing.  Heart is regular  irregular rate and rhythm borderline tachycardic at 100  with occasional irregular beats there is no lower extremity edema.  Abdomen is soft does not appear to be acutely tender-  with positive bowel sounds--it is somewhat obese  GU cannot really appreciate any suprapubic tenderness or discharge  .  Muscle skeletal does have right-sided weakness with contracture of her right upper extremity wrist and hand this is baseline.--Moves her left upper extremity a  Neurologic again right-sided deficits as noted above she is a phasic--again she looks like she is able to move her left upper extremity appears relatively neurologically at baseline although difficult to tell since she is a phasic and does not follow any verbal commands      Labs  Most recent metabolic panel showed a sodium of 136 potassium 4 BUN 21 creatinine 1.4.  Her white count which had been high had normalized at 9.7 hemoglobin 11.8 platelets 283  11/16/2014.  The VBAC 17.6 hemoglobin 15.2 platelets 36.  Liver function tests albumin 3.8 ALT 12 otherwise liver function tests within normal limits.  Sodium 135 potassium 4.6 BUN 49 creatinine 1.0.    10/21/2014.  WBC 8.3 hemoglobin 10.8.  Sodium 137 potassium 4.6 BUN 29 creatinine 1.4 CO2 28.  Albumin 3.5.  Hemoglobin A1c 7.8   08/30/2014.  Sodium 136 potassium 4.4 BUN 27 creatinine 1.3.  Albumin 3.4 alkaline phosphatase 110 otherwise liver function tests within normal limits.   TSH-1.19.   WBC 9.1 hemoglobin 10.7 platelets 268   08/02/2014.  WBC 8.3 hemoglobin 11.1 platelets 251.  Sodium 137 potassium 4.3 BUN 22 creatinine 1.2.  Hemoglobin A1c-8.1  06/17/2014.  WBC 9.3  hemoglobin 11.0 platelets 232.  Hemoglobin A1c 9.9.  Sodium 139 potassium 4.9 BUN 30 creatinine 1.18.  Liver function tests within normal limits  06/08/2014.  Sodium 135 potassium 4.3 BUN 26 creatinine 1.17 of note glucose is elevated at 279   23rd 2016.  Sodium 139 potassium 4.1 BUN 24 creatinine 0.9 glucose 150.      03/10/2014.  BNP is less than 5.  02/17/2014.  Hemoglobin A1c 6.7.  02/14/2014.  WBC 10.6 hemoglobin 12.1 platelets 225.  Sodium 138 potassium 4.1 BUN 28 creatinine 1.2.  Glucose 194.  Liver function tests within normal limits.  Amylase 82-lipase 35  12/07/2013.  Sodium 139 potassium 4.5 BUN 21 creatinine 1.3  09/10/2013.  WBC 9.0 hemoglobin 11.2 platelets 221.    Liver function tests within normal limits except albumin of 3.3   Assessment and plan.   #1-altered mental status lethargy-per nursing staff patient is fairly acutely changed from her baseline not eating and drinking lethargic she does respond somewhat to t the sternal rub does move her left upper extremity-we will send her to the ER for evaluation-vital signs again appear to be stable   [id number]--acute change requiring urgent hospital evaluation.        --     .9              . .Marland Kitchen

## 2014-11-28 NOTE — ED Notes (Signed)
Patient transported to CT 

## 2014-11-29 ENCOUNTER — Observation Stay (HOSPITAL_COMMUNITY): Payer: Medicare Other

## 2014-11-29 ENCOUNTER — Encounter (HOSPITAL_COMMUNITY): Payer: Self-pay | Admitting: *Deleted

## 2014-11-29 DIAGNOSIS — E119 Type 2 diabetes mellitus without complications: Secondary | ICD-10-CM

## 2014-11-29 DIAGNOSIS — G9340 Encephalopathy, unspecified: Secondary | ICD-10-CM | POA: Diagnosis not present

## 2014-11-29 DIAGNOSIS — Z794 Long term (current) use of insulin: Secondary | ICD-10-CM

## 2014-11-29 DIAGNOSIS — F329 Major depressive disorder, single episode, unspecified: Secondary | ICD-10-CM

## 2014-11-29 DIAGNOSIS — F0391 Unspecified dementia with behavioral disturbance: Secondary | ICD-10-CM

## 2014-11-29 DIAGNOSIS — F0390 Unspecified dementia without behavioral disturbance: Secondary | ICD-10-CM

## 2014-11-29 DIAGNOSIS — R4182 Altered mental status, unspecified: Secondary | ICD-10-CM | POA: Diagnosis not present

## 2014-11-29 LAB — CBC
HCT: 34.4 % — ABNORMAL LOW (ref 36.0–46.0)
Hemoglobin: 11 g/dL — ABNORMAL LOW (ref 12.0–15.0)
MCH: 27.6 pg (ref 26.0–34.0)
MCHC: 32 g/dL (ref 30.0–36.0)
MCV: 86.4 fL (ref 78.0–100.0)
Platelets: 240 10*3/uL (ref 150–400)
RBC: 3.98 MIL/uL (ref 3.87–5.11)
RDW: 16.1 % — ABNORMAL HIGH (ref 11.5–15.5)
WBC: 8.6 10*3/uL (ref 4.0–10.5)

## 2014-11-29 LAB — COMPREHENSIVE METABOLIC PANEL
ALBUMIN: 3.4 g/dL — AB (ref 3.5–5.0)
ALK PHOS: 81 U/L (ref 38–126)
ALT: 25 U/L (ref 14–54)
AST: 20 U/L (ref 15–41)
Anion gap: 9 (ref 5–15)
BUN: 29 mg/dL — AB (ref 6–20)
CHLORIDE: 104 mmol/L (ref 101–111)
CO2: 23 mmol/L (ref 22–32)
CREATININE: 1.29 mg/dL — AB (ref 0.44–1.00)
Calcium: 9.2 mg/dL (ref 8.9–10.3)
GFR calc non Af Amer: 40 mL/min — ABNORMAL LOW (ref >60)
GFR, EST AFRICAN AMERICAN: 46 mL/min — AB (ref >60)
GLUCOSE: 162 mg/dL — AB (ref 65–99)
Potassium: 4.7 mmol/L (ref 3.5–5.1)
SODIUM: 136 mmol/L (ref 135–145)
Total Bilirubin: 0.9 mg/dL (ref 0.3–1.2)
Total Protein: 6.8 g/dL (ref 6.5–8.1)

## 2014-11-29 LAB — PHOSPHORUS: Phosphorus: 3.8 mg/dL (ref 2.5–4.6)

## 2014-11-29 LAB — VITAMIN B12: VITAMIN B 12: 937 pg/mL — AB (ref 180–914)

## 2014-11-29 LAB — FOLATE: FOLATE: 68.4 ng/mL (ref >5.9)

## 2014-11-29 LAB — AMMONIA: Ammonia: 20 umol/L (ref 9–35)

## 2014-11-29 LAB — GLUCOSE, CAPILLARY
GLUCOSE-CAPILLARY: 117 mg/dL — AB (ref 65–99)
Glucose-Capillary: 132 mg/dL — ABNORMAL HIGH (ref 65–99)
Glucose-Capillary: 121 mg/dL — ABNORMAL HIGH (ref 65–99)
Glucose-Capillary: 104 mg/dL — ABNORMAL HIGH (ref 65–99)

## 2014-11-29 LAB — MAGNESIUM: Magnesium: 2 mg/dL (ref 1.7–2.4)

## 2014-11-29 LAB — MRSA PCR SCREENING: MRSA BY PCR: NEGATIVE

## 2014-11-29 LAB — TSH: TSH: 0.836 u[IU]/mL (ref 0.350–4.500)

## 2014-11-29 MED ORDER — SODIUM CHLORIDE 0.9 % IJ SOLN
3.0000 mL | Freq: Two times a day (BID) | INTRAMUSCULAR | Status: DC
  Administered 2014-11-29: 3 mL via INTRAVENOUS
  Administered 2014-11-29: 10 mL via INTRAVENOUS
  Administered 2014-11-30 – 2014-12-01 (×4): 3 mL via INTRAVENOUS
  Administered 2014-12-02: 10 mL via INTRAVENOUS

## 2014-11-29 MED ORDER — INSULIN ASPART 100 UNIT/ML ~~LOC~~ SOLN
0.0000 [IU] | SUBCUTANEOUS | Status: DC
  Administered 2014-11-30 – 2014-12-03 (×8): 1 [IU] via SUBCUTANEOUS
  Administered 2014-12-03: 2 [IU] via SUBCUTANEOUS

## 2014-11-29 MED ORDER — SODIUM CHLORIDE 0.9 % IV SOLN
750.0000 mg | Freq: Two times a day (BID) | INTRAVENOUS | Status: DC
  Administered 2014-11-30 – 2014-12-02 (×5): 750 mg via INTRAVENOUS
  Filled 2014-11-29 (×6): qty 7.5

## 2014-11-29 MED ORDER — POLYVINYL ALCOHOL 1.4 % OP SOLN
1.0000 [drp] | Freq: Two times a day (BID) | OPHTHALMIC | Status: DC
  Administered 2014-11-29 – 2014-12-03 (×8): 1 [drp] via OPHTHALMIC
  Filled 2014-11-29: qty 15

## 2014-11-29 MED ORDER — TOBRAMYCIN 0.3 % OP OINT
TOPICAL_OINTMENT | Freq: Four times a day (QID) | OPHTHALMIC | Status: DC
  Administered 2014-11-29: 10:00:00 via OPHTHALMIC
  Administered 2014-11-29: 1 via OPHTHALMIC
  Administered 2014-11-29 – 2014-12-01 (×7): via OPHTHALMIC
  Administered 2014-12-01: 1 via OPHTHALMIC
  Administered 2014-12-01 – 2014-12-03 (×5): via OPHTHALMIC
  Filled 2014-11-29: qty 3.5

## 2014-11-29 MED ORDER — LEVETIRACETAM 500 MG/5ML IV SOLN
1000.0000 mg | Freq: Once | INTRAVENOUS | Status: AC
  Administered 2014-11-29: 1000 mg via INTRAVENOUS
  Filled 2014-11-29: qty 10

## 2014-11-29 MED ORDER — SODIUM CHLORIDE 0.9 % IV SOLN: 500.0000 mg | Freq: Two times a day (BID) | INTRAVENOUS | Status: DC

## 2014-11-29 MED ORDER — SODIUM CHLORIDE 0.45 % IV SOLN
INTRAVENOUS | Status: AC
  Administered 2014-11-29 (×2): via INTRAVENOUS

## 2014-11-29 MED ORDER — ENOXAPARIN SODIUM 30 MG/0.3ML ~~LOC~~ SOLN
30.0000 mg | SUBCUTANEOUS | Status: DC
  Administered 2014-11-29 – 2014-12-03 (×5): 30 mg via SUBCUTANEOUS
  Filled 2014-11-29 (×5): qty 0.3

## 2014-11-29 MED ORDER — GLUCERNA SHAKE PO LIQD
237.0000 mL | Freq: Two times a day (BID) | ORAL | Status: DC
  Administered 2014-11-30 – 2014-12-03 (×4): 237 mL via ORAL

## 2014-11-29 NOTE — Care Management Note (Signed)
Case Management Note  Patient Details  Name: Katie BravoJuanita House MRN: [id number] Date of Birth: 09/04/1940  Subjective/Objective:      Date: 11/29/14 Spoke with patient  with  Lucious Grovesudrey Savas , daughter [phone], Melody Valletta. Introduced self as Sports coachcase manager and explained role in discharge planning and how to be reached. Verified [facility], [facility], . Expressed potential no  need for no other DME. Verified patient anticipates to go, SNF at time of discharge and will have full-time supervision by facility at this time to best of their knowledge. Patient  denied needing help with their medication. Patient  is driven by facility to MD appointments. Verified patient has PCP Arlo at the facility.   Plan: CM will continue to follow for discharge planning and [facility] resources.               Action/Plan:   Expected Discharge Date:                  Expected Discharge Plan:  Skilled Nursing Facility  In-House Referral:  Clinical Social Work  Discharge planning Services  CM Consult  Post Acute Care Choice:    Choice offered to:     DME Arranged:    DME Agency:     HH Arranged:    HH Agency:     Status of Service:  Completed, signed off  Medicare Important Message Given:    Date Medicare IM Given:    Medicare IM give by:    Date Additional Medicare IM Given:    Additional Medicare Important Message give by:     If discussed at Long Length of Stay Meetings, dates discussed:    Additional Comments:  Leone Havenaylor, Daleigh Pollinger [place], RN 11/29/2014, 3:16 PM

## 2014-11-29 NOTE — Progress Notes (Addendum)
1334: Medical release form completed by daughter who stated patient had stent placed mid April 2006. All records requested from April 2006- April 2007 [facility]. Fax receipt printed and placed in chart.   1630: [facility] contacted to verify if fax received and stated they have not received form. Form faxed once again. Fax receipt printed and placed in chart.    5:47 PM [facility] again and stated they are sending the paperwork now. Fax number verified. Awaiting paperwork.   6:31 PM Paperwork received and placed in chart. Dr. Isidoro Donningai aware.

## 2014-11-29 NOTE — Progress Notes (Signed)
Initial Nutrition Assessment  DOCUMENTATION CODES:   Not applicable  INTERVENTION:   -Glucerna Shake po BID, each supplement provides 220 kcal and 10 grams of protein  NUTRITION DIAGNOSIS:   Inadequate oral intake related to lethargy/confusion as evidenced by meal completion < 25%.  GOAL:   Patient will meet greater than or equal to 90% of their needs  MONITOR:   PO intake, Supplement acceptance, Labs, Weight trends, Skin, I & O's  REASON FOR ASSESSMENT:   Low Braden    ASSESSMENT:   Katie House is a [age] y.o. female with a past medical history of dementia, type 2 diabetes, stroke with right-sided spasticity, glaucoma, hypertension, hyperlipidemia, depression who is brought from his skilled nursing facility due to several days of increased lethargy, inability to interact and follow commands. Patient was recently treated for UTI and pneumonia at the nursing home. Per family members, there has not been documented fever recently at the nursing home facility.  Pt admitted with altered mental status. [facility] is a [facility].   Pt unable to provide hx due to cognitive deficit. She did not awake during exam. No family present to provide additional history.   Wt hx reveals wt stability < 1 year.   Nutrition-Focused physical exam completed. Findings are no fat depletion, mild muscle depletion, and no edema. Noted pt's rt upper extremity contracted, likely as a result of previous CVA.  Breakfast tray at bedside table was untouched. Plan for MRI later today.   Labs reviewed.  Diet Order:  Diet heart healthy/carb modified Room service appropriate?: Yes; Fluid consistency:: Thin  Skin:  Reviewed, no issues  Last BM:  PTA  Height:   Ht Readings from Last 1 Encounters:  11/29/14 5' 7.2" (1.707 m)    Weight:   Wt Readings from Last 1 Encounters:  11/29/14 120 lb 9.5 oz (54.7 kg)    Ideal Body Weight:  61.4 kg  BMI:  Body mass index is 18.77  kg/(m^2).  Estimated Nutritional Needs:   Kcal:  1400-1600  Protein:  60-75 grams  Fluid:  1.4-1.6 L  EDUCATION NEEDS:   No education needs identified at this time  Katie House, RD, LDN, CDE Pager: [phone] After hours Pager: [phone]

## 2014-11-29 NOTE — Progress Notes (Signed)
[facility]                                                                              Patient Demographics  Katie House, is a 74 y.o. female, DOB - 1940/06/02, ZOX:[id number]  Admit date - 11/28/2014   Admitting Physician Bobette Mo, MD  Outpatient Primary MD for the patient is Dasanayaka, Newton Pigg, MD  LOS -    Chief Complaint  Patient presents with  . Altered Mental Status       Brief HPI   Per Dr. Robb Matar on 11/29/14 Katie House is a 74 y.o. female with a past medical history of dementia, type 2 diabetes, stroke with right-sided spasticity, glaucoma, hypertension, hyperlipidemia, depression who is brought from his skilled nursing facility due to several days of increased lethargy, inability to interact and follow commands. Patient was recently treated for UTI and pneumonia at the nursing home. Per family members, there has not been documented fever recently at the nursing home facility.    Assessment & Plan    Principal Problem:   Acute encephalopathy with underlying history of dementia, type 2 diabetes, prior stroke with right-sided spasticity. - Still lethargic, opens her eyes and tracks but not verbal - CBC, BMET unremarkable, creatinine was 1.3 at the time of admission, improving to 1.2. Ammonia level 20. Lactic acid normal. TSH normal 0.83. UA negative for any UTI - CT head showed no acute intracranial abnormalities, redemonstration of extensive encephalomalacia of the left hemisphere with stent in the proximal left MCA. - Chest x-ray negative. - Called neurology consult - obtain B12, folate, RPR, EEG. -MRI could not be obtained due to shunt and clips from prior stroke   Active Problems:   Dementia - Hold PO meds  Seizure disorder: patient is on Keppra and Lamictal prior to admission:  - Neurology consult called and discussed with Dr. Roseanne Reno, recommended giving one loading dose of 1gm Keppra IV and increase Keppra to 750 mg q 12  hours - Patient is on Lamictal, hold - Obtain EEG     Diabetes mellitus (HCC) - Place on sliding scale insulin - Obtain hemoglobin A1c  Hypertension - Place on IV hydralazine as needed with parameters -  hold oral antihypertensives   prior CVA with right-sided spasticity  - Not on any antiplatelet agents PTA  Code Status: Full CODE STATUS   family Communication: discussed with patient's daughter, updated all the labs, imaging and plan of care.  Disposition Plan:   Time Spent in minutes  25 minutes  Procedures  CT head  Consults   None   DVT Prophylaxis  Lovenox   Medications  Scheduled Meds: . enoxaparin (LOVENOX) injection  30 mg Subcutaneous Q24H  . feeding supplement (GLUCERNA SHAKE)  237 mL Oral BID BM  . polyvinyl alcohol  1 drop Both Eyes BID  . sodium chloride  3 mL Intravenous Q12H  . tobramycin   Left Eye QID   Continuous Infusions: . sodium chloride 75 mL/hr at 11/29/14 0158   PRN Meds:.   Antibiotics   Anti-infectives    None        Subjective:  Katie House was seen and examined today. Unable to obtain any ROS from patient, lethargic, confused. No fevers.    Objective:   Blood pressure 154/61, pulse 51, temperature 98.3 F (36.8 C), temperature source Oral, resp. rate 18, height 5' 7.2" (1.707 m), weight 54.7 kg (120 lb 9.5 oz), SpO2 100 %.  Wt Readings from Last 3 Encounters:  11/29/14 54.7 kg (120 lb 9.5 oz)  06/10/13 57.425 kg (126 lb 9.6 oz)  08/26/12 57.063 kg (125 lb 12.8 oz)     Intake/Output Summary (Last 24 hours) at 11/29/14 1402 Last data filed at 11/29/14 1301  Gross per 24 hour  Intake  495.5 ml  Output      0 ml  Net  495.5 ml    Exam  General: Awake, tracks eyes, but does not follow any commands    HEENT:  PERRLA, EOMI, Anicteric Sclera, mucous membranes moist.   Neck: Supple, no JVD, no masses  CVS: S1 S2 auscultated, no rubs, murmurs or gallops. Regular rate and rhythm.  Respiratory: Clear to  auscultation bilaterally, no wheezing, rales or rhonchi  Abdomen: Soft, nontender, nondistended, + bowel sounds  Ext: no cyanosis clubbing, both lower extremity in heel protectors  Neuro: right upper extremity spasticity, does not follow commands  Skin: No rashes  Psych:  does not follow any commands   Data Review   Micro Results Recent Results (from the past 240 hour(s))  MRSA PCR Screening     Status: None   Collection Time: 11/29/14  2:08 AM  Result Value Ref Range Status   MRSA by PCR NEGATIVE NEGATIVE Final    Comment:        The GeneXpert MRSA Assay (FDA approved for NASAL specimens only), is one component of a comprehensive MRSA colonization surveillance program. It is not intended to diagnose MRSA infection nor to guide or monitor treatment for MRSA infections.     Radiology Reports Ct Head Wo Contrast  11/28/2014  CLINICAL DATA:  74 year old female with a history of altered mental status, increased lethargy. Prior stroke. EXAM: CT HEAD WITHOUT CONTRAST TECHNIQUE: Contiguous axial images were obtained from the base of the skull through the vertex without intravenous contrast. COMPARISON:  CT 03/24/2014 FINDINGS: Unremarkable appearance of the calvarium without acute fracture or aggressive lesion. Unremarkable appearance of the scalp soft tissues. Unremarkable appearance of the bilateral orbits. Persistent dislocation of the left mandibular condyles. Mastoid air cells are clear. No significant paranasal sinus disease Re- demonstration of extensive encephalomalacia of the left hemisphere. Ex vacuo dilation of the left ventricular system. Intracranial stent of the proximal left MCA. No acute intracranial hemorrhage. No midline shift. No mass effect. Gray-white differentiation of the posterior fossa and the right cerebral hemisphere relatively maintained. Hypodensity in the right-sided periventricular white matter. IMPRESSION: No CT evidence of acute intracranial abnormality.  Re- demonstration of extensive encephalomalacia of the left hemisphere with stent in the proximal left MCA. Signed, Yvone NeuJaime S. Loreta AveWagner, DO [facility] Electronically Signed   By: Gilmer MorJaime  Wagner D.O.   On: 11/28/2014 21:37   Dg Chest Portable 1 View  11/28/2014  CLINICAL DATA:  Altered mental status, diabetes mellitus, hypertension, prior stroke EXAM: PORTABLE CHEST 1 VIEW COMPARISON:  Portable exam 2023 hours without priors for comparison. FINDINGS: Patient's RIGHT arm is contracted and obscures the RIGHT lung base. Patient's chin obscures the RIGHT apex. Normal heart size, mediastinal contours and pulmonary vascularity. Visualized lungs clear. No gross pleural effusion or pneumothorax. IMPRESSION:  No definite acute abnormalities within limitations of exam. Electronically Signed   By: Ulyses Southward M.D.   On: 11/28/2014 20:33    CBC  Recent Labs Lab 11/28/14 2043 11/29/14 0558  WBC 10.3 8.6  HGB 12.4 11.0*  HCT 38.9 34.4*  PLT 262 240  MCV 85.7 86.4  MCH 27.3 27.6  MCHC 31.9 32.0  RDW 15.9* 16.1*    Chemistries   Recent Labs Lab 11/28/14 2043 11/29/14 0558  NA 138 136  K 4.6 4.7  CL 107 104  CO2 21* 23  GLUCOSE 148* 162*  BUN 30* 29*  CREATININE 1.37* 1.29*  CALCIUM 9.8 9.2  MG  --  2.0  AST 20 20  ALT 28 25  ALKPHOS 85 81  BILITOT 0.7 0.9   ------------------------------------------------------------------------------------------------------------------ estimated creatinine clearance is 33 mL/min (by C-G formula based on Cr of 1.29). ------------------------------------------------------------------------------------------------------------------ No results for input(s): HGBA1C in the last 72 hours. ------------------------------------------------------------------------------------------------------------------ No results for input(s): CHOL, HDL, LDLCALC, TRIG, CHOLHDL, LDLDIRECT in the last 72  hours. ------------------------------------------------------------------------------------------------------------------  Recent Labs  11/29/14 0558  TSH 0.836   ------------------------------------------------------------------------------------------------------------------ No results for input(s): VITAMINB12, FOLATE, FERRITIN, TIBC, IRON, RETICCTPCT in the last 72 hours.  Coagulation profile No results for input(s): INR, PROTIME in the last 168 hours.  No results for input(s): DDIMER in the last 72 hours.  Cardiac Enzymes No results for input(s): CKMB, TROPONINI, MYOGLOBIN in the last 168 hours.  Invalid input(s): CK ------------------------------------------------------------------------------------------------------------------ Invalid input(s): POCBNP   Recent Labs  11/28/14 1934 11/29/14 0749 11/29/14 1227  GLUCAP 138* 132* 121*     Katilyn Miltenberger M.D. [facility] 11/29/2014, 2:02 PM  Pager: [phone] Between 7am to 7pm - call Pager - [phone]  After 7pm go to [email or web address] - password [email or web address]  Call night coverage person covering after 7pm

## 2014-11-29 NOTE — Progress Notes (Signed)
EEG completed, results pending. 

## 2014-11-29 NOTE — Progress Notes (Signed)
Noted pt.has no admission strips. Called central monitor Teshia & said it will come in a few minutes.

## 2014-11-29 NOTE — Procedures (Signed)
EEG report.  Brief clinical history:  74 y.o. female with a past medical history of dementia, type 2 diabetes, stroke with right-sided spasticity, seizures, glaucoma, hypertension, hyperlipidemia, depression who is brought from his skilled nursing facility due to several days of increased lethargy, inability to interact and follow commands. Patient was recently treated for UTI and pneumonia at the nursing home. Per family members, there has not been documented fever recently at the nursing home facility.  Technique: this is a 17 channel routine scalp EEG performed at the bedside with bipolar and monopolar montages arranged in accordance to the international 10/20 system of electrode placement. One channel was dedicated to EKG recording.  No sleep was achieved during recording. No activating procedures performed.  Description: the study is obscured by significant amount of muscle and movement artifacts. However, the readable portion of the test reveals evidence of diffuse theta activity predominantly in the 5-6 Hz range. No focal or generalized epileptiform discharges noted.  No electrographic seizures seen. EKG showed sinus rhythm.   Impression: this is an abnormal awake EEG because of the presence of diffusely continuous theta slowing. Overall, these findings are indicative of a mild to moderate non specific encephalopathy as can be seen in a variety of toxic, metabolic disturbances as well as degenerative neurological disorders like dementia. No electrographic seizures noted.  Clinical correlation is advised.   Wyatt Portelasvaldo Camilo, MD [facility]

## 2014-11-29 NOTE — Consult Note (Signed)
Date: 11/29/2014               Patient Name:  Katie House MRN: [id number]  DOB: Mar 02, 1940 Age / Sex: 74 y.o., female   PCP: Angela Cox, MD         Requesting Physician: Dr. 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  The patient is unable to provide hx but her daughter is present for my exam and provides the hx.  The patient began to be less responsive about five days ago and has not been eating well.  She was recently treated for UTI and finished treatment last week but daughter feels her behavior was at baseline and she had recovered.    At baseline the patient is minimally verbal in the setting of aphasia from prior stroke and requires SNF level care at [facility].  Daughter reports she began to lose her vision about 10 years ago and has been completely blind for about 3 years.  She says she saw an ophthalmologist two years ago that felt cataract surgery would not be beneficial because the vision loss was likely unrelated to cataract.  Her mom is able to communicate her needs by pointing and is able to verbalize some things.  The seizures she has had in the past involve alteration in consciousness but not tonic-clonic.  Daughter says she has not had seizure in several years but reports that the SNF physician recently increased one of her seizure mediations.  Chart review reveals she is receiving Tramadol once per day  for pain.  Meds: Current Facility-Administered Medications  Medication Dose Route Frequency Provider Last Rate Last Dose  . 0.45 % sodium chloride infusion   Intravenous Continuous Bobette Mo, MD 75 mL/hr at 11/29/14 0158    . enoxaparin (LOVENOX) injection 30 mg  30 mg Subcutaneous Q24H Bobette Mo, MD   30 mg at 11/29/14 1006  . feeding supplement (GLUCERNA SHAKE) (GLUCERNA SHAKE) liquid 237 mL  237 mL Oral BID BM Jenifer A Williams, RD   237 mL at 11/29/14 1400  . levETIRAcetam (KEPPRA) 1,000 mg in sodium chloride 0.9 % 100 mL IVPB  1,000 mg Intravenous Once Ripudeep K Rai, MD      . levETIRAcetam (KEPPRA) 750 mg in sodium chloride 0.9 % 100 mL IVPB  750 mg Intravenous Q12H Ripudeep K Rai, MD      . polyvinyl alcohol (LIQUIFILM TEARS) 1.4 % ophthalmic solution 1 drop  1 drop Both Eyes BID Bobette Mo, MD   1 drop at 11/29/14 1006  . sodium chloride 0.9 % injection 3 mL  3 mL Intravenous Q12H Bobette Mo, MD   3 mL at 11/29/14 1010  . tobramycin (TOBREX) 0.3 % ophthalmic ointment   Left Eye QID Bobette Mo, MD        Allergies: Allergies as of 11/28/2014 - Review Complete 11/28/2014  Allergen  Reaction Noted  . Latex  06/01/2012   Past Medical History  Diagnosis Date  . Dementia   . Diabetes mellitus (HCC)   . High blood pressure   . Glaucoma   . Cataracts, both eyes   . Hyperlipemia   . Depression   . Constipation   . Anemia   . Stroke (HCC)   . Dysphagia, unspecified(787.20)   . Dementia, unspecified, without behavioral disturbance    History reviewed. No pertinent past surgical history. History reviewed. No pertinent family history. Social History   Social History  . Marital Status: Divorced    Spouse Name: N/A  . Number of Children: N/A  . Years of Education: N/A   Occupational History  . Not on file.   Social History Main Topics  . Smoking status: Never Smoker   . Smokeless tobacco: Not on file  . Alcohol Use: No  . Drug  Use: No  . Sexual Activity: No   Other Topics Concern  . Not on file   Social History Narrative    Review of Systems:   Physical Exam: Blood pressure 154/61, pulse 51, temperature 98.3 F (36.8 C), temperature source Oral, resp. rate 18, height 5' 7.2" (1.707 m), weight 120 lb 9.5 oz (54.7 kg), SpO2 100 %. General: resting in bed HEENT: MMM, no scleral icterus Cardiac: regularly irregular, no rubs, murmurs or gallops Pulm: clear to auscultation bilaterally, moving normal volumes of air Abd: soft, nontender, nondistended, BS present Ext: warm and well perfused, no pedal edema  Neurologic Exam:   Mental Status: Patient is not alert.  Not following commands.  Cranial Nerves:   II: Unable to examine as patient is forcing her eyelids shut.  III/IV/VI: Unable to examine as patient is forcing her eyelids shut.  V/VII: Face symmetric. She is able to force eyelids shut.  VIII: Unable  IX/X: Unable  XI: Unable  XII: Unable  Motor:  Increased tone lower extremities and left upper extremity w/ contracture of RUE; right foot drop  Sensory:  She is not able to localize pain.  DTRs: 3+ LUE, 1+ patellar B/L  Plantars:  Downgoing bilaterally  Cerebellar: Unable to examine    Lab results: Basic Metabolic Panel:  Recent Labs  69/62/9510/17/16 2043 11/29/14 0558  NA 138 136  K 4.6 4.7  CL 107 104  CO2 21* 23  GLUCOSE 148* 162*  BUN 30* 29*  CREATININE 1.37* 1.29*  CALCIUM 9.8 9.2  MG  --  2.0  PHOS  --  3.8   Liver Function Tests:  Recent Labs  11/28/14 2043 11/29/14 0558  AST 20 20  ALT 28 25  ALKPHOS 85 81  BILITOT 0.7 0.9  PROT 7.4 6.8  ALBUMIN 3.8 3.4*   Recent Labs  11/29/14 0558  AMMONIA 20   CBC:  Recent Labs  11/28/14 2043 11/29/14 0558  WBC 10.3 8.6  HGB 12.4 11.0*  HCT 38.9 34.4*  MCV 85.7 86.4  PLT 262 240   CBG:  Recent Labs  11/28/14 1934 11/29/14 0749 11/29/14 1227  GLUCAP 138* 132* 121*   Thyroid Function Tests:  Recent Labs   11/29/14 0558  TSH 0.836   Urinalysis:  Recent Labs  11/28/14 1950  COLORURINE YELLOW  LABSPEC 1.018  PHURINE 5.5  GLUCOSEU NEGATIVE  HGBUR NEGATIVE  BILIRUBINUR NEGATIVE  KETONESUR NEGATIVE  PROTEINUR NEGATIVE  UROBILINOGEN 0.2  NITRITE NEGATIVE  LEUKOCYTESUR NEGATIVE    Imaging results:  Ct Head Wo Contrast  11/28/2014  CLINICAL DATA:  74 year old female with a history of altered mental status, increased lethargy. Prior stroke. EXAM: CT HEAD WITHOUT CONTRAST TECHNIQUE: Contiguous axial images were obtained from the base of the skull through the vertex without intravenous contrast. COMPARISON:  CT 03/24/2014 FINDINGS: Unremarkable appearance of the calvarium without acute fracture or aggressive lesion. Unremarkable appearance of the scalp soft tissues. Unremarkable appearance of the bilateral orbits. Persistent dislocation of the left mandibular condyles. Mastoid air cells are clear. No significant paranasal sinus disease Re- demonstration of extensive encephalomalacia of the left hemisphere. Ex vacuo dilation of the left ventricular system. Intracranial stent of the proximal left MCA. No acute intracranial hemorrhage. No midline shift. No mass effect. Gray-white differentiation of the posterior fossa and the right cerebral hemisphere relatively maintained. Hypodensity in the right-sided periventricular white matter. IMPRESSION: No CT evidence of acute intracranial abnormality. Re- demonstration of extensive encephalomalacia of the left hemisphere with stent in the proximal left MCA. Signed, Yvone Neu. Loreta Ave, DO [facility] Electronically Signed   By: Gilmer Mor D.O.   On: 11/28/2014 21:37   Dg Chest Portable 1 View  11/28/2014  CLINICAL DATA:  Altered mental status, diabetes mellitus, hypertension, prior stroke EXAM: PORTABLE CHEST 1 VIEW COMPARISON:  Portable exam 2023 hours without priors for comparison. FINDINGS: Patient's  RIGHT arm is contracted and obscures the RIGHT lung base. Patient's chin obscures the RIGHT apex. Normal heart size, mediastinal contours and pulmonary vascularity. Visualized lungs clear. No gross pleural effusion or pneumothorax. IMPRESSION: No definite acute abnormalities within limitations of exam. Electronically Signed   By: Ulyses Southward M.D.   On: 11/28/2014 20:33    Assessment, Plan, & Recommendations by Problem: 74 year old woman with HTN, DM, HLD, Alzheimer's, hemorrhagic CVA, L MCA stent, residual left sided weakness here with increasing lethargy and unresponsiveness in recent days.  Acute encephalopathy:  Differential includes seizure (hx + Tramadol use) vs acute CVA.  No obvious signs of infection and electrolytes okay.  She has some rigidity on exam but I think this is likely her baseline spasticity due to CVA/imobility and no hyperthermia, tremor to suggests serotonin syndrome.   - awaiting outside records to determine if MCA stent is MRI compatible; if it is she will need brain MRI  - EEG to evaluate for seizure activity; continue Keppra at increased dose - agree with not giving home Tramadol and would be sure to have this medication removed from her med list at discharge - hold CNS active medications for now (besides AED) - continue supportive care  Signed: Yolanda Manges, DO  [facility] PGY3 on Neurology Service 11/29/2014, 2:16 PM   Patient seen and examined together with Dr Enid Baas PGY 3 and I concur with the assessment and plan.  Wyatt Portela, MD

## 2014-11-29 NOTE — ED Notes (Signed)
Called MRI reported unable to do MRI due to shunt and clip daughter and Dr Adriana Simasook made aware.

## 2014-11-29 NOTE — Progress Notes (Addendum)
Patient has not voided all day. Bladder scan resulted 750ml. Dr. Isidoro Donningai paged. Awaiting orders.   Went in to perform the in and out cath on the patient and she had already voided in the bed.  Performed a post residual bladder scan and got around 100 ml.  In and out cath was not performed.

## 2014-11-29 NOTE — H&P (Signed)
[facility] History and Physical  Katie House ZOX:[id number] DOB: 11-Oct-1940 DOA: 11/28/2014  Referring physician: Donnetta Hutching, MD PCP: Angela Cox, MD   Chief Complaint: Altered Mental Status  HPI: Katie House is a 74 y.o. female with a past medical history of dementia, type 2 diabetes, stroke with right-sided spasticity, glaucoma, hypertension, hyperlipidemia, depression who is brought from his skilled nursing facility due to several days of increased lethargy, inability to interact and follow commands. Patient was recently treated for UTI and pneumonia at the nursing home. Per family members, there has not been documented fever recently at the nursing home facility.   Review of Systems:   Unable to review.  Past Medical History  Diagnosis Date  . Dementia   . Diabetes mellitus (HCC)   . High blood pressure   . Glaucoma   . Cataracts, both eyes   . Hyperlipemia   . Depression   . Constipation   . Anemia   . Stroke (HCC)   . Dysphagia, unspecified(787.20)   . Dementia, unspecified, without behavioral disturbance    History reviewed. No pertinent past surgical history. Social History:  reports that she has never smoked. She does not have any smokeless tobacco history on file. She reports that she does not drink alcohol or use illicit drugs.  Allergies  Allergen Reactions  . Latex     unknown    History reviewed. No pertinent family history.    Prior to Admission medications   Medication Sig Start Date End Date Taking? Authorizing Provider  amLODipine (NORVASC) 10 MG tablet Take 10 mg by mouth daily.   Yes Historical Provider, MD  amoxicillin (AMOXIL) 875 MG tablet Take 875 mg by mouth 2 (two) times daily. Take for 7 days Start date 11-08-14   Yes Historical Provider, MD  DULoxetine (CYMBALTA) 60 MG capsule Take 60 mg by mouth daily.   Yes Historical Provider, MD  hydrALAZINE (APRESOLINE) 10 MG tablet Take 10 mg by mouth 3 (three) times daily.   Yes  Historical Provider, MD  insulin aspart (NOVOLOG) 100 UNIT/ML injection Inject 2-8 Units into the skin 3 (three) times daily before meals. 61-200=0 units, 201-250=2 units, 251-300=4 units, 301-350=6 units, 351-400=8 units if above 400 give 8 units and notify provider.   Yes Historical Provider, MD  insulin detemir (LEVEMIR) 100 UNIT/ML injection Inject 17 Units into the skin at bedtime.   Yes Historical Provider, MD  lamoTRIgine (LAMICTAL) 150 MG tablet Take 150 mg by mouth daily. Notify provider immediately if patient develops rash   Yes Historical Provider, MD  levETIRAcetam (KEPPRA) 500 MG tablet Take 500 mg by mouth 2 (two) times daily. For seizure disorder   Yes Historical Provider, MD  loratadine (CLARITIN) 10 MG tablet Take 10 mg by mouth daily. For allergies   Yes Historical Provider, MD  losartan (COZAAR) 100 MG tablet Take 100 mg by mouth daily. For HTN   Yes Historical Provider, MD  methylcellulose (ARTIFICIAL TEARS) 1 % ophthalmic solution Place 1 drop into both eyes 2 (two) times daily. For dry eyes   Yes Historical Provider, MD  mirtazapine (REMERON) 15 MG tablet Take 15 mg by mouth at bedtime.   Yes Historical Provider, MD  polyethylene glycol powder (MIRALAX) powder Take 17 g by mouth daily. For constipation   Yes Historical Provider, MD  saccharomyces boulardii (FLORASTOR) 250 MG capsule Take 250 mg by mouth 2 (two) times daily. Take for 10 days start date on 11-08-14   Yes Historical Provider, MD  traMADol (ULTRAM) 50 MG tablet Take 50 mg by mouth daily.   Yes Historical Provider, MD  traMADol (ULTRAM) 50 MG tablet Take one tablet by mouth once daily for pain. Hold for sedation (control) Patient not taking: Reported on 11/28/2014 09/08/14   Sharon Seller, NP   Physical Exam: Filed Vitals:   11/28/14 2200 11/28/14 2215 11/28/14 2230 11/28/14 2300  BP: 140/59  141/59 146/42  Pulse: 46 46  49  Temp:      TempSrc:      Resp: SpO2: 100% 100%  100%    Wt Readings  from Last 3 Encounters:  06/10/13 57.425 kg (126 lb 9.6 oz)  08/26/12 57.063 kg (125 lb 12.8 oz)    General:  Appears calm and comfortable Eyes: PERRL, left side conjunctiva and sclera are injected. There is a dry purulent discharge from the left. ENT:  lips & tongue are dry Neck: no LAD, masses or thyromegaly Cardiovascular: RRR, no m/r/g. No LE edema. Telemetry: SR, no arrhythmias  Respiratory: CTA bilaterally, no w/r/r. Normal respiratory effort. Abdomen: soft, ntnd Skin: no rash or induration seen on limited exam Musculoskeletal: Right-sided spasticity Psychiatric: Lethargic. Neurologic: Right-sided spasticity. she was able to squeeze my fingers when asked.           Labs on Admission:  Basic Metabolic Panel:  Recent Labs Lab 11/28/14 2043  NA 138  K 4.6  CL 107  CO2 21*  GLUCOSE 148*  BUN 30*  CREATININE 1.37*  CALCIUM 9.8   Liver Function Tests:  Recent Labs Lab 11/28/14 2043  AST 20  ALT 28  ALKPHOS 85  BILITOT 0.7  PROT 7.4  ALBUMIN 3.8    CBC:  Recent Labs Lab 11/28/14 2043  WBC 10.3  HGB 12.4  HCT 38.9  MCV 85.7  PLT 262     CBG:  Recent Labs Lab 11/28/14 1934  GLUCAP 138*    Radiological Exams on Admission: Ct Head Wo Contrast  11/28/2014  CLINICAL DATA:  74 year old female with a history of altered mental status, increased lethargy. Prior stroke. EXAM: CT HEAD WITHOUT CONTRAST TECHNIQUE: Contiguous axial images were obtained from the base of the skull through the vertex without intravenous contrast. COMPARISON:  CT 03/24/2014 FINDINGS: Unremarkable appearance of the calvarium without acute fracture or aggressive lesion. Unremarkable appearance of the scalp soft tissues. Unremarkable appearance of the bilateral orbits. Persistent dislocation of the left mandibular condyles. Mastoid air cells are clear. No significant paranasal sinus disease Re- demonstration of extensive encephalomalacia of the left hemisphere. Ex vacuo dilation of  the left ventricular system. Intracranial stent of the proximal left MCA. No acute intracranial hemorrhage. No midline shift. No mass effect. Gray-white differentiation of the posterior fossa and the right cerebral hemisphere relatively maintained. Hypodensity in the right-sided periventricular white matter. IMPRESSION: No CT evidence of acute intracranial abnormality. Re- demonstration of extensive encephalomalacia of the left hemisphere with stent in the proximal left MCA. Signed, Yvone Neu. Loreta Ave, DO [facility] Radiology Electronically Signed   By: Gilmer Mor D.O.   On: 11/28/2014 21:37   Dg Chest Portable 1 View  11/28/2014  CLINICAL DATA:  Altered mental status, diabetes mellitus, hypertension, prior stroke EXAM: PORTABLE CHEST 1 VIEW COMPARISON:  Portable exam 2023 hours without priors for comparison. FINDINGS: Patient's RIGHT arm is contracted and obscures the RIGHT lung base. Patient's chin obscures the RIGHT apex. Normal heart size, mediastinal contours and pulmonary vascularity. Visualized lungs clear.  No gross pleural effusion or pneumothorax. IMPRESSION: No definite acute abnormalities within limitations of exam. Electronically Signed   By: Ulyses SouthwardMark  Boles M.D.   On: 11/28/2014 20:33    EKG: Independently reviewed. Vent. rate 91 BPM PR interval 150 ms QRS duration 74 ms QT/QTc 344/423 ms P-R-T axes 61 3 53 Sinus rhythm Supraventricular bigeminy LAE, consider biatrial enlargement  Assessment/Plan Principal Problem:   Change in mental status Unclear as to what may be causing the symptoms. The patient has been afebrile. Workup has been so far negative for clear etiology. Will get MRI of the brain in the morning. Check ammonia level, TSH, magnesium level, phosphorus level.  Active Problems:   Dementia As above.    Diabetes mellitus (HCC) Monitor CBG.    Hyperlipemia   Depression   Essential hypertension, benign   Late effects  of cerebrovascular disease   Diabetes type 2, controlled (HCC) Hold oral medications until the patient's mental status improves.     Code Status: Full Code. DVT Prophylaxis: Lovenox SQ. Family Communication:  Disposition Plan: Admit to observation and further work up.  Time spent: Over 70 minutes were spent in   Bobette MoDavid Manuel Ortiz [facility] Pager [phone].

## 2014-11-29 NOTE — Progress Notes (Signed)
Daughter called fpr updates on the patient.  She expressed concerns about patient having an MRI because the family is concerned about possible bleeding on the brain.  Will express the family concerns with the doctor.

## 2014-11-30 DIAGNOSIS — H540 Blindness, both eyes: Secondary | ICD-10-CM | POA: Diagnosis present

## 2014-11-30 DIAGNOSIS — R4182 Altered mental status, unspecified: Secondary | ICD-10-CM | POA: Diagnosis not present

## 2014-11-30 DIAGNOSIS — F329 Major depressive disorder, single episode, unspecified: Secondary | ICD-10-CM | POA: Diagnosis present

## 2014-11-30 DIAGNOSIS — E119 Type 2 diabetes mellitus without complications: Secondary | ICD-10-CM | POA: Diagnosis not present

## 2014-11-30 DIAGNOSIS — E785 Hyperlipidemia, unspecified: Secondary | ICD-10-CM

## 2014-11-30 DIAGNOSIS — Z95828 Presence of other vascular implants and grafts: Secondary | ICD-10-CM | POA: Diagnosis not present

## 2014-11-30 DIAGNOSIS — I6990 Unspecified sequelae of unspecified cerebrovascular disease: Secondary | ICD-10-CM

## 2014-11-30 DIAGNOSIS — Z794 Long term (current) use of insulin: Secondary | ICD-10-CM | POA: Diagnosis not present

## 2014-11-30 DIAGNOSIS — G40909 Epilepsy, unspecified, not intractable, without status epilepticus: Secondary | ICD-10-CM | POA: Diagnosis present

## 2014-11-30 DIAGNOSIS — E1136 Type 2 diabetes mellitus with diabetic cataract: Secondary | ICD-10-CM | POA: Diagnosis present

## 2014-11-30 DIAGNOSIS — R1310 Dysphagia, unspecified: Secondary | ICD-10-CM | POA: Diagnosis present

## 2014-11-30 DIAGNOSIS — G309 Alzheimer's disease, unspecified: Secondary | ICD-10-CM | POA: Diagnosis present

## 2014-11-30 DIAGNOSIS — H269 Unspecified cataract: Secondary | ICD-10-CM | POA: Diagnosis present

## 2014-11-30 DIAGNOSIS — Z87440 Personal history of urinary (tract) infections: Secondary | ICD-10-CM | POA: Diagnosis not present

## 2014-11-30 DIAGNOSIS — I10 Essential (primary) hypertension: Secondary | ICD-10-CM

## 2014-11-30 DIAGNOSIS — F0390 Unspecified dementia without behavioral disturbance: Secondary | ICD-10-CM | POA: Diagnosis not present

## 2014-11-30 DIAGNOSIS — H409 Unspecified glaucoma: Secondary | ICD-10-CM | POA: Diagnosis present

## 2014-11-30 DIAGNOSIS — E1139 Type 2 diabetes mellitus with other diabetic ophthalmic complication: Secondary | ICD-10-CM | POA: Diagnosis present

## 2014-11-30 DIAGNOSIS — I69351 Hemiplegia and hemiparesis following cerebral infarction affecting right dominant side: Secondary | ICD-10-CM | POA: Diagnosis not present

## 2014-11-30 DIAGNOSIS — Z8701 Personal history of pneumonia (recurrent): Secondary | ICD-10-CM | POA: Diagnosis not present

## 2014-11-30 DIAGNOSIS — G9340 Encephalopathy, unspecified: Secondary | ICD-10-CM | POA: Diagnosis not present

## 2014-11-30 DIAGNOSIS — I69398 Other sequelae of cerebral infarction: Secondary | ICD-10-CM | POA: Diagnosis not present

## 2014-11-30 DIAGNOSIS — R252 Cramp and spasm: Secondary | ICD-10-CM | POA: Diagnosis present

## 2014-11-30 DIAGNOSIS — Z7401 Bed confinement status: Secondary | ICD-10-CM | POA: Diagnosis not present

## 2014-11-30 DIAGNOSIS — I69320 Aphasia following cerebral infarction: Secondary | ICD-10-CM | POA: Diagnosis not present

## 2014-11-30 DIAGNOSIS — F0280 Dementia in other diseases classified elsewhere without behavioral disturbance: Secondary | ICD-10-CM | POA: Diagnosis present

## 2014-11-30 LAB — GLUCOSE, CAPILLARY
GLUCOSE-CAPILLARY: 86 mg/dL (ref 65–99)
GLUCOSE-CAPILLARY: 85 mg/dL (ref 65–99)
Glucose-Capillary: 113 mg/dL — ABNORMAL HIGH (ref 65–99)
Glucose-Capillary: 124 mg/dL — ABNORMAL HIGH (ref 65–99)
Glucose-Capillary: 124 mg/dL — ABNORMAL HIGH (ref 65–99)
Glucose-Capillary: 92 mg/dL (ref 65–99)

## 2014-11-30 LAB — BASIC METABOLIC PANEL
ANION GAP: 10 (ref 5–15)
BUN: 20 mg/dL (ref 6–20)
CALCIUM: 9 mg/dL (ref 8.9–10.3)
CO2: 20 mmol/L — AB (ref 22–32)
CREATININE: 1.18 mg/dL — AB (ref 0.44–1.00)
Chloride: 103 mmol/L (ref 101–111)
GFR, EST AFRICAN AMERICAN: 51 mL/min — AB (ref >60)
GFR, EST NON AFRICAN AMERICAN: 44 mL/min — AB (ref >60)
Glucose, Bld: 128 mg/dL — ABNORMAL HIGH (ref 65–99)
Potassium: 4.2 mmol/L (ref 3.5–5.1)
SODIUM: 133 mmol/L — AB (ref 135–145)

## 2014-11-30 LAB — CBC
HEMATOCRIT: 30.2 % — AB (ref 36.0–46.0)
Hemoglobin: 9.7 g/dL — ABNORMAL LOW (ref 12.0–15.0)
MCH: 27.4 pg (ref 26.0–34.0)
MCHC: 32.1 g/dL (ref 30.0–36.0)
MCV: 85.3 fL (ref 78.0–100.0)
PLATELETS: 219 10*3/uL (ref 150–400)
RBC: 3.54 MIL/uL — ABNORMAL LOW (ref 3.87–5.11)
RDW: 15.7 % — AB (ref 11.5–15.5)
WBC: 7.8 10*3/uL (ref 4.0–10.5)

## 2014-11-30 LAB — RPR: RPR: NONREACTIVE

## 2014-11-30 LAB — HIV ANTIBODY (ROUTINE TESTING W REFLEX): HIV SCREEN 4TH GENERATION: NONREACTIVE

## 2014-11-30 LAB — HEMOGLOBIN A1C
HEMOGLOBIN A1C: 8.8 % — AB (ref 4.8–5.6)
MEAN PLASMA GLUCOSE: 206 mg/dL

## 2014-11-30 NOTE — Progress Notes (Signed)
PT Cancellation Note  Patient Details Name: Katie BravoJuanita House MRN: [id number] DOB: 03/19/1940   Cancelled Treatment:    Reason Eval/Treat Not Completed: Patient not medically ready Pt on strict bedrest. Will await increase in activity orders prior to initiation of PT evaluation. Will follow up next available time.   Blake DivineShauna A Caitlin Ainley 11/30/2014, 9:17 AM Mylo RedShauna Anant Agard, PT, DPT [phone]

## 2014-11-30 NOTE — NC FL2 (Deleted)
[place] MEDICAID FL2 LEVEL OF CARE SCREENING TOOL     IDENTIFICATION  Patient Name: Katie House Birthdate: [birth year] Sex: female Admission Date (Current Location): 11/28/2014  [place] Number:   [id number]  Facility and Address:  [facility] [address] Provider Number: [phone]   Attending Physician Name and Address:  Cathren Harsh, MD  Relative Name and Address:  Tessie Fass- daughter    Current Level of Care: Hospital Recommended Level of Care: Nursing Facility Prior Approval Number:    Date Approved/Denied:   PASRR Number:    Discharge Plan: SNF    Current Diagnoses: Patient Active Problem List   Diagnosis Date Noted  . Change in mental status 11/29/2014  . Acute encephalopathy 11/29/2014  . Leukocytosis 11/17/2014  . Pneumonia 11/17/2014  . Diabetes type 2, controlled (HCC) 06/23/2014  . Conjunctivitis 03/13/2014  . Abdominal pain 02/17/2014  . UTI (urinary tract infection) 08/29/2013  . Arrhythmia 08/29/2013  . VP (ventriculoperitoneal) shunt status 08/27/2013  . Abdominal pain, unspecified site 08/19/2013  . Pre-ulcerative calluses 03/23/2013  . Chronic kidney disease 11/27/2012  . 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 Unspecified disorder of kidney and ureter 11/03/2012  . Acute renal failure (HCC) 09/17/2012  . Seizures (HCC) 08/26/2012  . Anemia of other chronic disease 08/11/2012  . Essential hypertension, benign 06/24/2012  . Alzheimer's disease 06/24/2012  . 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 Unspecified constipation 06/24/2012  . Dementia   . Diabetes mellitus (HCC)   . High blood pressure   . Glaucoma   . Cataracts, both eyes   . Hyperlipemia   . Depression   . Constipation   . Anemia   . Stroke (HCC)   . Dysphagia, unspecified(787.20)   . Dementia without behavioral disturbance     DISORIENTED AMBULATORY STATUS BLADDER BOWEL  Constantly  Non-Ambulatory Incontinent Continent  INAPPROPRIATE BEHAVIOR FUNCTIONAL LIMITATIONS COMMUNICATION OF NEEDS nonverbal RESPIRATION    Speech   Normal  PERSONAL CARE ASSISTANCE ACTIVITIES/SOCIAL SKIN normal NUTRITION STATUS  Total care     Supplemental  PHYSICIAN VISITS NEUROLOGICAL      Convulsions/Seizures     SPECIAL CARE FACTORS FREQUENCY  PT (By licensed PT)                   Current Medications (11/30/2014): Current Facility-Administered Medications  Medication Dose Route Frequency Provider Last Rate Last Dose  . enoxaparin (LOVENOX) injection 30 mg  30 mg Subcutaneous Q24H Bobette Mo, MD   30 mg at 11/30/14 1046  . feeding supplement (GLUCERNA SHAKE) (GLUCERNA SHAKE) liquid 237 mL  237 mL Oral BID BM Jenifer A Williams, RD   237 mL at 11/30/14 1045  . insulin aspart (novoLOG) injection 0-9 Units  0-9 Units Subcutaneous 6 times per day Ripudeep  Luo, MD   1 Units at 11/30/14 0835  . levETIRAcetam (KEPPRA) 750 mg in sodium chloride 0.9 % 100 mL IVPB  750 mg Intravenous Q12H Ripudeep K Rai, MD   750 mg at 11/30/14 1426  . polyvinyl alcohol (LIQUIFILM TEARS) 1.4 % ophthalmic solution 1 drop  1 drop Both Eyes BID Bobette Mo, MD   1 drop at 11/30/14 1046  . sodium chloride 0.9 % injection 3 mL  3 mL Intravenous Q12H Bobette Mo, MD   3 mL at 11/30/14 1046  . tobramycin (TOBREX) 0.3 % ophthalmic ointment   Left Eye QID Bobette Mo, MD  Do not use this list as official medication orders. Please verify with discharge summary.  Discharge Medications:   Medication List    ASK your doctor about these medications        amLODipine 10 MG tablet  Commonly known as:  NORVASC  Take 10 mg by mouth daily.     amoxicillin 875 MG tablet  Commonly known as:  AMOXIL  Take 875 mg by mouth 2 (two) times daily. Take for 7 days Start date 11-08-14     DULoxetine 60 MG capsule  Commonly known as:  CYMBALTA  Take 60 mg by mouth daily.     hydrALAZINE 10 MG  tablet  Commonly known as:  APRESOLINE  Take 10 mg by mouth 3 (three) times daily.     insulin aspart 100 UNIT/ML injection  Commonly known as:  novoLOG  Inject 2-8 Units into the skin 3 (three) times daily before meals. 61-200=0 units, 201-250=2 units, 251-300=4 units, 301-350=6 units, 351-400=8 units if above 400 give 8 units and notify provider.     insulin detemir 100 UNIT/ML injection  Commonly known as:  LEVEMIR  Inject 17 Units into the skin at bedtime.     KEPPRA 500 MG tablet  Generic drug:  levETIRAcetam  Take 500 mg by mouth 2 (two) times daily. For seizure disorder     lamoTRIgine 150 MG tablet  Commonly known as:  LAMICTAL  Take 150 mg by mouth daily. Notify provider immediately if patient develops rash     loratadine 10 MG tablet  Commonly known as:  CLARITIN  Take 10 mg by mouth daily. For allergies     losartan 100 MG tablet  Commonly known as:  COZAAR  Take 100 mg by mouth daily. For HTN     methylcellulose 1 % ophthalmic solution  Commonly known as:  ARTIFICIAL TEARS  Place 1 drop into both eyes 2 (two) times daily. For dry eyes     MIRALAX powder  Generic drug:  polyethylene glycol powder  Take 17 g by mouth daily. For constipation     mirtazapine 15 MG tablet  Commonly known as:  REMERON  Take 15 mg by mouth at bedtime.     saccharomyces boulardii 250 MG capsule  Commonly known as:  FLORASTOR  Take 250 mg by mouth 2 (two) times daily. Take for 10 days start date on 11-08-14     traMADol 50 MG tablet  Commonly known as:  ULTRAM  Take 50 mg by mouth daily.     traMADol 50 MG tablet  Commonly known as:  ULTRAM  Take one tablet by mouth once daily for pain. Hold for sedation (control)           [place], Binnie RailJenna M, LCSW

## 2014-11-30 NOTE — Progress Notes (Signed)
[facility]                                                                              Patient Demographics  Katie House, is a 74 y.o. female, DOB - 07/07/1940, ZOX:[id number]  Admit date - 11/28/2014   Admitting Physician Bobette Moavid Manuel Ortiz, MD  Outpatient Primary MD for the patient is Dasanayaka, Newton PiggGayani Y, MD  LOS -    Chief Complaint  Patient presents with  . Altered Mental Status       Brief HPI   Per Dr. Robb Matarrtiz on 11/29/14 Katie House is a 74 y.o. female with a past medical history of dementia, type 2 diabetes, stroke with right-sided spasticity, glaucoma, hypertension, hyperlipidemia, depression who is brought from his skilled nursing facility due to several days of increased lethargy, inability to interact and follow commands. Patient was recently treated for UTI and pneumonia at the nursing home. Per family members, there has not been documented fever recently at the nursing home facility.    Assessment & Plan    Principal Problem:   Acute encephalopathy with underlying history of dementia, type 2 diabetes, prior stroke with right-sided spasticity. - Slightly better from yesterday still not following commands or verbal - CBC, BMET unremarkable, creatinine was 1.3 at the time of admission, now 1.1  -  Ammonia level 20. Lactic acid normal. TSH normal 0.83. UA negative for any UTI - CT head showed no acute intracranial abnormalities, redemonstration of extensive encephalomalacia of the left hemisphere with stent in the proximal left MCA. - Chest x-ray negative. - Neurology following, also reviewed the records from [facility], admission from 2006 when she had the stroke, MCA stents were placed, unable to assess if compatible with MRI will await neurology recommendations -EEG showed diffuse continuous theta slowing, indicative of mild to moderate nonspecific encephalopathy,  no electrographic seizures   Active Problems:   Dementia - Hold PO  meds  Seizure disorder: patient is on Keppra and Lamictal prior to admission:  - Continue Keppra, increased to 750 mg q 12 hours by neurology - Patient is on Lamictal, hold - EEG did not show any seizures consistent with diffuse slowing, nonspecific encephalopathy    Diabetes mellitus (HCC) - Place on sliding scale insulin - Obtain hemoglobin A1c  Hypertension - Place on IV hydralazine as needed with parameters -  hold oral antihypertensives   prior CVA with right-sided spasticity  - Not on any antiplatelet agents PTA  Code Status: Full CODE STATUS   family Communication: discussed with patient's daughter, updated all the labs, imaging and plan of care yesterday.  Disposition Plan:   Time Spent in minutes  25 minutes  Procedures  CT head EEG  Consults   None   DVT Prophylaxis  Lovenox   Medications  Scheduled Meds: . enoxaparin (LOVENOX) injection  30 mg Subcutaneous Q24H  . feeding supplement (GLUCERNA SHAKE)  237 mL Oral BID BM  . insulin aspart  0-9 Units Subcutaneous 6 times per day  . levETIRAcetam  750 mg Intravenous Q12H  . polyvinyl alcohol  1 drop Both Eyes BID  . sodium chloride  3 mL Intravenous Q12H  .  tobramycin   Left Eye QID   Continuous Infusions:   PRN Meds:.   Antibiotics   Anti-infectives    None        Subjective:   Katie House was seen and examined today. Somewhat more alert, during examination did not want to be examined and jerked my hand away. Unable to obtain any ROS from patient, lethargic, confused. No fevers.    Objective:   Blood pressure 159/74, pulse 85, temperature 98.3 F (36.8 C), temperature source Oral, resp. rate 18, height 5' 7.2" (1.707 m), weight 54.7 kg (120 lb 9.5 oz), SpO2 97 %.  Wt Readings from Last 3 Encounters:  11/29/14 54.7 kg (120 lb 9.5 oz)  06/10/13 57.425 kg (126 lb 9.6 oz)  08/26/12 57.063 kg (125 lb 12.8 oz)     Intake/Output Summary (Last 24 hours) at 11/30/14 1245 Last data filed  at 11/30/14 1008  Gross per 24 hour  Intake  842.5 ml  Output    900 ml  Net  -57.5 ml    Exam  General: Does not follow any commands  HEENT:  PERRLA, EOMI, Anicteric Sclera, mucous membranes moist.   Neck: Supple, no JVD, no masses  CVS: S1 S2 clear, RRR  Respiratory: CTAB  Abdomen: Soft, nontender, nondistended, + bowel sounds  Ext: no cyanosis clubbing, both lower extremity in heel protectors  Neuro: right upper extremity spasticity, does not follow commands  Skin: No rashes  Psych:  does not follow any commands   Data Review   Micro Results Recent Results (from the past 240 hour(s))  MRSA PCR Screening     Status: None   Collection Time: 11/29/14  2:08 AM  Result Value Ref Range Status   MRSA by PCR NEGATIVE NEGATIVE Final    Comment:        The GeneXpert MRSA Assay (FDA approved for NASAL specimens only), is one component of a comprehensive MRSA colonization surveillance program. It is not intended to diagnose MRSA infection nor to guide or monitor treatment for MRSA infections.     Radiology Reports Ct Head Wo Contrast  11/28/2014  CLINICAL DATA:  74 year old female with a history of altered mental status, increased lethargy. Prior stroke. EXAM: CT HEAD WITHOUT CONTRAST TECHNIQUE: Contiguous axial images were obtained from the base of the skull through the vertex without intravenous contrast. COMPARISON:  CT 03/24/2014 FINDINGS: Unremarkable appearance of the calvarium without acute fracture or aggressive lesion. Unremarkable appearance of the scalp soft tissues. Unremarkable appearance of the bilateral orbits. Persistent dislocation of the left mandibular condyles. Mastoid air cells are clear. No significant paranasal sinus disease Re- demonstration of extensive encephalomalacia of the left hemisphere. Ex vacuo dilation of the left ventricular system. Intracranial stent of the proximal left MCA. No acute intracranial hemorrhage. No midline shift. No mass  effect. Gray-white differentiation of the posterior fossa and the right cerebral hemisphere relatively maintained. Hypodensity in the right-sided periventricular white matter. IMPRESSION: No CT evidence of acute intracranial abnormality. Re- demonstration of extensive encephalomalacia of the left hemisphere with stent in the proximal left MCA. Signed, Yvone Neu. Loreta Ave, DO [facility] Radiology Electronically Signed   By: Gilmer Mor D.O.   On: 11/28/2014 21:37   Dg Chest Portable 1 View  11/28/2014  CLINICAL DATA:  Altered mental status, diabetes mellitus, hypertension, prior stroke EXAM: PORTABLE CHEST 1 VIEW COMPARISON:  Portable exam 2023 hours without priors for comparison. FINDINGS: Patient's RIGHT arm is contracted and obscures the RIGHT  lung base. Patient's chin obscures the RIGHT apex. Normal heart size, mediastinal contours and pulmonary vascularity. Visualized lungs clear. No gross pleural effusion or pneumothorax. IMPRESSION: No definite acute abnormalities within limitations of exam. Electronically Signed   By: Ulyses Southward M.D.   On: 11/28/2014 20:33    CBC  Recent Labs Lab 11/28/14 2043 11/29/14 0558 11/30/14 0543  WBC 10.3 8.6 7.8  HGB 12.4 11.0* 9.7*  HCT 38.9 34.4* 30.2*  PLT 262 240 219  MCV 85.7 86.4 85.3  MCH 27.3 27.6 27.4  MCHC 31.9 32.0 32.1  RDW 15.9* 16.1* 15.7*    Chemistries   Recent Labs Lab 11/28/14 2043 11/29/14 0558 11/30/14 0543  NA 138 136 133*  K 4.6 4.7 4.2  CL 107 104 103  CO2 21* 23 20*  GLUCOSE 148* 162* 128*  BUN 30* 29* 20  CREATININE 1.37* 1.29* 1.18*  CALCIUM 9.8 9.2 9.0  MG  --  2.0  --   AST 20 20  --   ALT 28 25  --   ALKPHOS 85 81  --   BILITOT 0.7 0.9  --    ------------------------------------------------------------------------------------------------------------------ estimated creatinine clearance is 36.1 mL/min (by C-G formula based on Cr of  1.18). ------------------------------------------------------------------------------------------------------------------  Recent Labs  11/29/14 1640  HGBA1C 8.8*   ------------------------------------------------------------------------------------------------------------------ No results for input(s): CHOL, HDL, LDLCALC, TRIG, CHOLHDL, LDLDIRECT in the last 72 hours. ------------------------------------------------------------------------------------------------------------------  Recent Labs  11/29/14 0558  TSH 0.836   ------------------------------------------------------------------------------------------------------------------  Recent Labs  11/29/14 1640  VITAMINB12 937*  FOLATE 68.4    Coagulation profile No results for input(s): INR, PROTIME in the last 168 hours.  No results for input(s): DDIMER in the last 72 hours.  Cardiac Enzymes No results for input(s): CKMB, TROPONINI, MYOGLOBIN in the last 168 hours.  Invalid input(s): CK ------------------------------------------------------------------------------------------------------------------ Invalid input(s): POCBNP   Recent Labs  11/29/14 1658 11/29/14 2031 11/30/14 0050 11/30/14 0434 11/30/14 0747 11/30/14 1153  GLUCAP 117* 104* 124* 113* 124* 92     Cornelis Kluver M.D. [facility] 11/30/2014, 12:45 PM  Pager: [phone] Between 7am to 7pm - call Pager - [phone]  After 7pm go to [email or web address] - password [email or web address]  Call night coverage person covering after 7pm

## 2014-11-30 NOTE — Clinical Social Work Note (Signed)
Clinical Social Work Assessment  Patient Details  Name: Katie BravoJuanita House MRN: [id number] Date of Birth: 08/13/1940  Date of referral:  11/30/14               Reason for consult:  Facility Placement                Permission sought to share information with:  Family Supports Permission granted to share information::  No (pt disoriented)  Name::     Social workerAudry  Agency::  [facility]  Relationship::  daughter  SolicitorContact Information:     Housing/Transportation Living arrangements for the past 2 months:  [facility]illed [facility]ursing Facility Source of Information:  Adult Children Patient Interpreter Needed:  None Criminal Activity/Legal Involvement Pertinent to Current Situation/Hospitalization:  No - Comment as needed Significant Relationships:  Adult Children Lives with:  Facility Resident Do you feel safe going back to the place where you live?  Yes Need for family participation in patient care:  Yes (Comment) (pt has dementia and is mainly nonverbal)  Care giving concerns:  None- pt is LTC resident at [facility]   Social Worker assessment / plan: CSW spoke with pt dtr about return to [facility] at time of DC  Employment status:  Retired Health and safety inspectornsurance information:  Medicare PT Recommendations:  Not assessed at this time Information / Referral to community resources:  Skilled Nursing Facility  Patient/Family's Response to care: Dtr is agreeable and states pt has been resident at [facility] for past 3 years  Patient/Family's Understanding of and Emotional Response to Diagnosis, Current Treatment, and Prognosis:  Pt daughter has no questions or concerns at this time  Emotional Assessment Appearance:    Attitude/Demeanor/Rapport:  Unable to Assess Affect (typically observed):  Unable to Assess Orientation:  Fluctuating Orientation (Suspected and/or reported Sundowners) Alcohol / Substance use:  Not Applicable Psych involvement (Current and /or in the community):  No (Comment)  Discharge Needs  Concerns  to be addressed:  Care Coordination Readmission within the last 30 days:  No Current discharge risk:  None Barriers to Discharge:  Continued Medical Work up   Peabody EnergyHoloman, Katie Germani M, LCSW 11/30/2014, 4:16 PM

## 2014-11-30 NOTE — Progress Notes (Signed)
Subjective: Ms. Katie House was seen and examined this AM.  She is more responsive that yesterday but still not following commands or speaking.  She uses her right hand to move my hand away during the exam.  She responds to pain in the extremities.  Objective: Vital signs in last 24 hours: Filed Vitals:   11/29/14 1340 11/29/14 2113 11/30/14 0522 11/30/14 0525  BP: 154/61 169/74 172/61 159/74  Pulse: 51 80 57 85  Temp: 98.3 F (36.8 C) 98.7 F (37.1 C) 98.3 F (36.8 C)   TempSrc: Oral Oral Oral   Resp: 18 18 18    Height:      Weight:      SpO2: 100% 97% 97%    Weight change:   Intake/Output Summary (Last 24 hours) at 11/30/14 0854 Last data filed at 11/30/14 0548  Gross per 24 hour  Intake  845.5 ml  Output    900 ml  Net  -54.5 ml   General: resting in bed in NAD HEENT: forces eyes shut Cardiac: RRR, no rubs, murmurs or gallops Pulm: clear to auscultation bilaterally, moving normal volumes of air Abd: soft, nontender, nondistended, BS present Ext: warm and well perfused, no pedal edema  Neurologic Exam:   Mental Status: Seems to be awake but not alert or following commands.  Cranial Nerves:   II: Right pupil ~ 3-584mm non-reactive  III/IV/VI: She is not tracking; right pupil NR; she will not allow left eyelid open  V/VII: Forces eyelids shut.  Forces lips shut.  VIII: Unable to assess  IX/X: Unable to assess  XI: Unable to assess  XII: Unable to assess  Motor:  Increase tone LE, contracture of right arm; R foot drop  Sensory:  Responding to pain/touch  DTRs: Unable to elicit in LE ( increased tone/spasticity)  Plantars:  Downgoing bilaterally  Cerebellar: Unable to assess    Lab Results: Basic Metabolic Panel:  Recent Labs Lab 11/29/14 0558 11/30/14 0543  NA 136 133*  K 4.7 4.2  CL 104 103  CO2 23 20*  GLUCOSE 162* 128*  BUN 29* 20  CREATININE 1.29* 1.18*  CALCIUM 9.2 9.0  MG 2.0  --   PHOS 3.8  --    Liver Function Tests:  Recent Labs Lab  11/28/14 2043 11/29/14 0558  AST 20 20  ALT 28 25  ALKPHOS 85 81  BILITOT 0.7 0.9  PROT 7.4 6.8  ALBUMIN 3.8 3.4*    Recent Labs Lab 11/29/14 0558  AMMONIA 20   CBC:  Recent Labs Lab 11/29/14 0558 11/30/14 0543  WBC 8.6 7.8  HGB 11.0* 9.7*  HCT 34.4* 30.2*  MCV 86.4 85.3  PLT 240 219   CBG:  Recent Labs Lab 11/29/14 1227 11/29/14 1658 11/29/14 2031 11/30/14 0050 11/30/14 0434 11/30/14 0747  GLUCAP 121* 117* 104* 124* 113* 124*   Hemoglobin A1C:  Recent Labs Lab 11/29/14 1640  HGBA1C 8.8*   Thyroid Function Tests:  Recent Labs Lab 11/29/14 0558  TSH 0.836   Anemia Panel:  Recent Labs Lab 11/29/14 1640  VITAMINB12 937*  FOLATE 68.4   Studies/Results: Ct Head Wo Contrast  11/28/2014  CLINICAL DATA:  74 year old female with a history of altered mental status, increased lethargy. Prior stroke. EXAM: CT HEAD WITHOUT CONTRAST TECHNIQUE: Contiguous axial images were obtained from the base of the skull through the vertex without intravenous contrast. COMPARISON:  CT 03/24/2014 FINDINGS: Unremarkable appearance of the calvarium without acute fracture or aggressive lesion. Unremarkable appearance of the scalp soft  tissues. Unremarkable appearance of the bilateral orbits. Persistent dislocation of the left mandibular condyles. Mastoid air cells are clear. No significant paranasal sinus disease Re- demonstration of extensive encephalomalacia of the left hemisphere. Ex vacuo dilation of the left ventricular system. Intracranial stent of the proximal left MCA. No acute intracranial hemorrhage. No midline shift. No mass effect. Gray-white differentiation of the posterior fossa and the right cerebral hemisphere relatively maintained. Hypodensity in the right-sided periventricular white matter. IMPRESSION: No CT evidence of acute intracranial abnormality. Re- demonstration of extensive encephalomalacia of the left hemisphere with stent in the proximal left MCA. Signed,  Yvone Neu. Loreta Ave, DO [facility] Electronically Signed   By: Gilmer Mor D.O.   On: 11/28/2014 21:37   Dg Chest Portable 1 View  11/28/2014  CLINICAL DATA:  Altered mental status, diabetes mellitus, hypertension, prior stroke EXAM: PORTABLE CHEST 1 VIEW COMPARISON:  Portable exam 2023 hours without priors for comparison. FINDINGS: Patient's RIGHT arm is contracted and obscures the RIGHT lung base. Patient's chin obscures the RIGHT apex. Normal heart size, mediastinal contours and pulmonary vascularity. Visualized lungs clear. No gross pleural effusion or pneumothorax. IMPRESSION: No definite acute abnormalities within limitations of exam. Electronically Signed   By: Ulyses Southward M.D.   On: 11/28/2014 20:33   Medications: I have reviewed the patient's current medications. Scheduled Meds: . enoxaparin (LOVENOX) injection  30 mg Subcutaneous Q24H  . feeding supplement (GLUCERNA SHAKE)  237 mL Oral BID BM  . insulin aspart  0-9 Units Subcutaneous 6 times per day  . levETIRAcetam  750 mg Intravenous Q12H  . polyvinyl alcohol  1 drop Both Eyes BID  . sodium chloride  3 mL Intravenous Q12H  . tobramycin   Left Eye QID   Continuous Infusions: none PRN Meds: none  Assessment/Plan: 74 year old woman with HTN, DM, HLD, Alzheimer's, hemorrhagic CVA, L MCA stent, residual left sided weakness being evaluated for acute encephalopathy in the setting of advanced dementia.  Acute encephalopathy:  The patient is more responsive today (responding to touch/pain) but no verbal response or attempt at non-verbal communication.  No evidence of seizures on EEG.  No signs of infectious or metabolic etiology.  Concern for recurrent stroke given her hx but cannot be corroborated by exam.  [facility] from [facility] (in patient's physical chart) were reviewed and confirm ischemic CVA in 2006 with stent placement to the MCA on 06/21/14.  The next day she had  worsening of symptoms and imaging revealed hemorrhage with hydrocephalus.  Records do not report type of stent or if MRI compatible.  I spoke with someone in our MR department and he reports that although a stent placed in 2006 is probably compatible there is no guarantee of compatibility. - need to determine type of stent placed (which will likely require phone call to [facility]) - if stent determined to be MRI compatible she will need this study as soon as possible - continue Keppra BID - continue to hold CNS active medicatons (other than AED) - continue supportive care  Yolanda Manges, DO  [facility] PGY3 on Neurology Service 11/30/2014, 8:54 AM   I personally participated in this patient's evaluation and management along with PGY3 Dr. Andrey Campanile, including formulating the above clinical impression and management recommendations.  Venetia Maxon M.D. [facility] [phone]

## 2014-11-30 NOTE — Progress Notes (Signed)
Pt has not voided tonight. Bladder scan showed 344 mL urine. Craige CottaKirby, NP paged. Will in/out cath.

## 2014-11-30 NOTE — NC FL2 (Signed)
[place] MEDICAID FL2 LEVEL OF CARE SCREENING TOOL     IDENTIFICATION  Patient Name: Katie House Birthdate: Mar 16, 1940 Sex: female Admission Date (Current Location): 11/28/2014  [facility] and [place] Number:    Facility and Address:  [facility] [address] Provider Number: [phone]   Attending Physician Name and Address:  Cathren Harsh, MD  Relative Name and Address:  Tessie Fass- daughter    Current Level of Care: Hospital Recommended Level of Care: Nursing Facility Prior Approval Number:    Date Approved/Denied:   PASRR Number:    Discharge Plan: SNF    Current Diagnoses: Patient Active Problem List   Diagnosis Date Noted  . Change in mental status 11/29/2014  . Acute encephalopathy 11/29/2014  . Leukocytosis 11/17/2014  . Pneumonia 11/17/2014  . Diabetes type 2, controlled (HCC) 06/23/2014  . Conjunctivitis 03/13/2014  . Abdominal pain 02/17/2014  . UTI (urinary tract infection) 08/29/2013  . Arrhythmia 08/29/2013  . VP (ventriculoperitoneal) shunt status 08/27/2013  . Abdominal pain, unspecified site 08/19/2013  . Pre-ulcerative calluses 03/23/2013  . Chronic kidney disease 11/27/2012  . 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 Unspecified disorder of kidney and ureter 11/03/2012  . Acute renal failure (HCC) 09/17/2012  . Seizures (HCC) 08/26/2012  . Anemia of other chronic disease 08/11/2012  . Essential hypertension, benign 06/24/2012  . Alzheimer's disease 06/24/2012  . 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 Unspecified constipation 06/24/2012  . Dementia   . Diabetes mellitus (HCC)   . High blood pressure   . Glaucoma   . Cataracts, both eyes   . Hyperlipemia   . Depression   . Constipation   . Anemia   . Stroke (HCC)   . Dysphagia, unspecified(787.20)   . Dementia without behavioral disturbance     DISORIENTED AMBULATORY STATUS BLADDER BOWEL  Constantly Non-Ambulatory  Incontinent Continent  INAPPROPRIATE BEHAVIOR FUNCTIONAL LIMITATIONS COMMUNICATION OF NEEDS RESPIRATION    Speech   Normal  PERSONAL CARE ASSISTANCE ACTIVITIES/SOCIAL SKIN NUTRITION STATUS  Total care     Supplemental  PHYSICIAN VISITS NEUROLOGICAL      Convulsions/Seizures     SPECIAL CARE FACTORS FREQUENCY  PT (By licensed PT)                   Current Medications (11/30/2014): Current Facility-Administered Medications  Medication Dose Route Frequency Provider Last Rate Last Dose  . enoxaparin (LOVENOX) injection 30 mg  30 mg Subcutaneous Q24H Bobette Mo, MD   30 mg at 11/30/14 1046  . feeding supplement (GLUCERNA SHAKE) (GLUCERNA SHAKE) liquid 237 mL  237 mL Oral BID BM Jenifer A Williams, RD   237 mL at 11/30/14 1045  . insulin aspart (novoLOG) injection 0-9 Units  0-9 Units Subcutaneous 6 times per day Ripudeep  Luo, MD   1 Units at 11/30/14 0835  . levETIRAcetam (KEPPRA) 750 mg in sodium chloride 0.9 % 100 mL IVPB  750 mg Intravenous Q12H Ripudeep K Rai, MD   750 mg at 11/30/14 1426  . polyvinyl alcohol (LIQUIFILM TEARS) 1.4 % ophthalmic solution 1 drop  1 drop Both Eyes BID Bobette Mo, MD   1 drop at 11/30/14 1046  . sodium chloride 0.9 % injection 3 mL  3 mL Intravenous Q12H Bobette Mo, MD   3 mL at 11/30/14 1046  . tobramycin (TOBREX) 0.3 % ophthalmic ointment   Left Eye QID Bobette Mo, MD       Do not  use this list as official medication orders. Please verify with discharge summary.  Discharge Medications:   Medication List    ASK your doctor about these medications        amLODipine 10 MG tablet  Commonly known as:  NORVASC  Take 10 mg by mouth daily.     amoxicillin 875 MG tablet  Commonly known as:  AMOXIL  Take 875 mg by mouth 2 (two) times daily. Take for 7 days Start date 11-08-14     DULoxetine 60 MG capsule  Commonly known as:  CYMBALTA  Take 60 mg by mouth daily.     hydrALAZINE 10 MG tablet  Commonly known as:   APRESOLINE  Take 10 mg by mouth 3 (three) times daily.     insulin aspart 100 UNIT/ML injection  Commonly known as:  novoLOG  Inject 2-8 Units into the skin 3 (three) times daily before meals. 61-200=0 units, 201-250=2 units, 251-300=4 units, 301-350=6 units, 351-400=8 units if above 400 give 8 units and notify provider.     insulin detemir 100 UNIT/ML injection  Commonly known as:  LEVEMIR  Inject 17 Units into the skin at bedtime.     KEPPRA 500 MG tablet  Generic drug:  levETIRAcetam  Take 500 mg by mouth 2 (two) times daily. For seizure disorder     lamoTRIgine 150 MG tablet  Commonly known as:  LAMICTAL  Take 150 mg by mouth daily. Notify provider immediately if patient develops rash     loratadine 10 MG tablet  Commonly known as:  CLARITIN  Take 10 mg by mouth daily. For allergies     losartan 100 MG tablet  Commonly known as:  COZAAR  Take 100 mg by mouth daily. For HTN     methylcellulose 1 % ophthalmic solution  Commonly known as:  ARTIFICIAL TEARS  Place 1 drop into both eyes 2 (two) times daily. For dry eyes     MIRALAX powder  Generic drug:  polyethylene glycol powder  Take 17 g by mouth daily. For constipation     mirtazapine 15 MG tablet  Commonly known as:  REMERON  Take 15 mg by mouth at bedtime.     saccharomyces boulardii 250 MG capsule  Commonly known as:  FLORASTOR  Take 250 mg by mouth 2 (two) times daily. Take for 10 days start date on 11-08-14     traMADol 50 MG tablet  Commonly known as:  ULTRAM  Take 50 mg by mouth daily.     traMADol 50 MG tablet  Commonly known as:  ULTRAM  Take one tablet by mouth once daily for pain. Hold for sedation (control)        Relevant Imaging Results:  Relevant Lab Results:  Recent Labs    Additional Information    Izora RibasHoloman, Yamilka Lopiccolo M, LCSW

## 2014-12-01 ENCOUNTER — Inpatient Hospital Stay (HOSPITAL_COMMUNITY): Payer: Medicare Other

## 2014-12-01 DIAGNOSIS — R4182 Altered mental status, unspecified: Secondary | ICD-10-CM | POA: Insufficient documentation

## 2014-12-01 LAB — CBC
HEMATOCRIT: 32.4 % — AB (ref 36.0–46.0)
Hemoglobin: 10.3 g/dL — ABNORMAL LOW (ref 12.0–15.0)
MCH: 26.8 pg (ref 26.0–34.0)
MCHC: 31.8 g/dL (ref 30.0–36.0)
MCV: 84.4 fL (ref 78.0–100.0)
PLATELETS: 236 10*3/uL (ref 150–400)
RBC: 3.84 MIL/uL — AB (ref 3.87–5.11)
RDW: 15.5 % (ref 11.5–15.5)
WBC: 7.9 10*3/uL (ref 4.0–10.5)

## 2014-12-01 LAB — BASIC METABOLIC PANEL
ANION GAP: 13 (ref 5–15)
BUN: 12 mg/dL (ref 6–20)
CO2: 18 mmol/L — AB (ref 22–32)
Calcium: 9.1 mg/dL (ref 8.9–10.3)
Chloride: 106 mmol/L (ref 101–111)
Creatinine, Ser: 1.13 mg/dL — ABNORMAL HIGH (ref 0.44–1.00)
GFR calc Af Amer: 54 mL/min — ABNORMAL LOW (ref >60)
GFR, EST NON AFRICAN AMERICAN: 47 mL/min — AB (ref >60)
GLUCOSE: 97 mg/dL (ref 65–99)
POTASSIUM: 3.9 mmol/L (ref 3.5–5.1)
Sodium: 137 mmol/L (ref 135–145)

## 2014-12-01 LAB — GLUCOSE, CAPILLARY
GLUCOSE-CAPILLARY: 86 mg/dL (ref 65–99)
GLUCOSE-CAPILLARY: 147 mg/dL — AB (ref 65–99)
Glucose-Capillary: 87 mg/dL (ref 65–99)
Glucose-Capillary: 93 mg/dL (ref 65–99)
Glucose-Capillary: 85 mg/dL (ref 65–99)
Glucose-Capillary: 96 mg/dL (ref 65–99)
Glucose-Capillary: 92 mg/dL (ref 65–99)

## 2014-12-01 MED ORDER — AMLODIPINE BESYLATE 10 MG PO TABS
10.0000 mg | ORAL_TABLET | Freq: Every day | ORAL | Status: DC
Start: 2014-12-01 — End: 2014-12-03
  Administered 2014-12-01 – 2014-12-03 (×3): 10 mg via ORAL
  Filled 2014-12-01 (×3): qty 1

## 2014-12-01 MED ORDER — HYDRALAZINE HCL 10 MG PO TABS
10.0000 mg | ORAL_TABLET | Freq: Three times a day (TID) | ORAL | Status: DC
  Administered 2014-12-01 – 2014-12-03 (×6): 10 mg via ORAL
  Filled 2014-12-01 (×6): qty 1

## 2014-12-01 MED ORDER — LAMOTRIGINE 25 MG PO TABS: 150.0000 mg | ORAL_TABLET | Freq: Every day | ORAL | Status: DC

## 2014-12-01 MED ORDER — LAMOTRIGINE 25 MG PO TABS
150.0000 mg | ORAL_TABLET | Freq: Every day | ORAL | Status: DC
  Administered 2014-12-02 – 2014-12-03 (×2): 150 mg via ORAL
  Filled 2014-12-01 (×3): qty 6

## 2014-12-01 MED ORDER — HYDRALAZINE HCL 20 MG/ML IJ SOLN
10.0000 mg | Freq: Four times a day (QID) | INTRAMUSCULAR | Status: DC | PRN
  Administered 2014-12-02: 10 mg via INTRAVENOUS
  Filled 2014-12-01: qty 1

## 2014-12-01 MED ORDER — MIRTAZAPINE 15 MG PO TABS
15.0000 mg | ORAL_TABLET | Freq: Every day | ORAL | Status: DC
  Administered 2014-12-01 – 2014-12-02 (×2): 15 mg via ORAL
  Filled 2014-12-01 (×2): qty 1

## 2014-12-01 NOTE — Progress Notes (Signed)
Pt has stents and clips in head, the nurse [facility] and was told that everything was compatible, we need DOCUMENTATION of this fact, and she also has a shunt that needs to be investigated.  Without written proof of the make/model of these devices, we can not legally put her in the scanner.  Please provide this information as soon as possible.  The radiologist makes the determination whether something is safe/conditional or unsafe in these instances.

## 2014-12-01 NOTE — Progress Notes (Signed)
Subjective: Ms. Maurice MarchLane was seen and examined today.  She had right eye partially open.  Her sister and RN say she was able to swallow pills this today.  Sister thinks she is more alert at times (able to nod to questioning) but still not back at baseline.  Objective: Vital signs in last 24 hours: Filed Vitals:   11/30/14 1433 11/30/14 2053 11/30/14 2055 12/01/14 0502  BP:  185/94 187/84 165/94  Pulse: 89 84 54 63  Temp:  97.6 F (36.4 C)  98.2 F (36.8 C)  TempSrc:  Oral  Oral  Resp:  20  18  Height:      Weight:      SpO2:  100%  98%   Weight change:   Intake/Output Summary (Last 24 hours) at 12/01/14 1159 Last data filed at 12/01/14 0909  Gross per 24 hour  Intake  107.5 ml  Output      0 ml  Net  107.5 ml   General: resting in bed in NAD HEENT: forces eyes shut Cardiac: tachy Pulm: clear to auscultation bilaterally, moving normal volumes of air Abd: soft, nontender, nondistended, BS present Ext: warm and well perfused, no pedal edema  Neurologic Exam:   Mental Status: Seems to be awake but not alert or following commands.  Cranial Nerves:   II: Right pupil ~ 3-464mm non-reactive  III/IV/VI: She is not tracking; right pupil NR; she will not allow left eyelid open  V/VII: Forces eyelids shut.  Forces lips shut.  VIII: Unable to assess  IX/X: Unable to assess  XI: Unable to assess  XII: Unable to assess  Motor:  Increase tone LE, contracture of right arm; R foot drop  Sensory:  Not responding  DTRs: Unable to elicit in LE ( increased tone/spasticity)  Plantars:  Downgoing bilaterally  Cerebellar: Unable to assess    Lab Results: Basic Metabolic Panel:  Recent Labs Lab 11/29/14 0558 11/30/14 0543 12/01/14 0550  NA 136 133* 137  K 4.7 4.2 3.9  CL 104 103 106  CO2 23 20* 18*  GLUCOSE 162* 128* 97  BUN 29* 20 12  CREATININE 1.29* 1.18* 1.13*  CALCIUM 9.2 9.0 9.1  MG 2.0  --   --   PHOS 3.8  --   --    Liver Function Tests:  Recent Labs Lab  11/28/14 2043 11/29/14 0558  AST 20 20  ALT 28 25  ALKPHOS 85 81  BILITOT 0.7 0.9  PROT 7.4 6.8  ALBUMIN 3.8 3.4*    Recent Labs Lab 11/29/14 0558  AMMONIA 20   CBC:  Recent Labs Lab 11/30/14 0543 12/01/14 0550  WBC 7.8 7.9  HGB 9.7* 10.3*  HCT 30.2* 32.4*  MCV 85.3 84.4  PLT 219 236   CBG:  Recent Labs Lab 11/30/14 1612 11/30/14 2049 12/01/14 0111 12/01/14 0439 12/01/14 0832 12/01/14 1130  GLUCAP 86 85 87 93 85 96   Hemoglobin A1C:  Recent Labs Lab 11/29/14 1640  HGBA1C 8.8*   Thyroid Function Tests:  Recent Labs Lab 11/29/14 0558  TSH 0.836   Anemia Panel:  Recent Labs Lab 11/29/14 1640  VITAMINB12 937*  FOLATE 68.4   Studies/Results: No results found. Medications: I have reviewed the patient's current medications. Scheduled Meds: . amLODipine  10 mg Oral Daily  . enoxaparin (LOVENOX) injection  30 mg Subcutaneous Q24H  . feeding supplement (GLUCERNA SHAKE)  237 mL Oral BID BM  . insulin aspart  0-9 Units Subcutaneous 6 times per day  .  levETIRAcetam  750 mg Intravenous Q12H  . mirtazapine  15 mg Oral QHS  . polyvinyl alcohol  1 drop Both Eyes BID  . sodium chloride  3 mL Intravenous Q12H  . tobramycin   Left Eye QID   Continuous Infusions: none PRN Meds: none  Assessment/Plan: 74 year old woman with HTN, DM, HLD, Alzheimer's, hemorrhagic CVA, L MCA stent, residual left sided weakness being evaluated for acute encephalopathy in the setting of advanced dementia.  Acute encephalopathy:  Etiology likely seizure but need to r/o acute CVA.  Primary team telephoned [facility] radiology and is waiting to hear back regarding stent compatibility. - if stent determined to be MRI compatible she will need this study as soon as possible - continue Keppra BID - add back home lamictal since she is taking po - continue to hold CNS active medicatons (other than AED) - continue supportive care  Yolanda Manges, DO  [facility] PGY3 on Neurology  Service 12/01/2014, 11:59 AM   I personally participated in this patient's evaluation and management along with PGY3 Dr. Andrey Campanile, including formulating the above clinical impression and management recommendations.   Venetia Maxon M.D. [facility] [phone]

## 2014-12-01 NOTE — Evaluation (Signed)
Physical Therapy Evaluation Patient Details Name: Katie House Katie House: [id number] DOB: 08/24/1940 Today's Date: 12/01/2014   History of Present Illness  Pt adm with AMS. PMH - dementia, lt CVA, DM  Clinical Impression  Pt appear total care for everything. Appears to be at baseline.    Follow Up Recommendations Other (comment) (Return to SNF)    Equipment Recommendations  None recommended by PT    Recommendations for Other Services       Precautions / Restrictions Precautions Precautions: Fall      Mobility  Bed Mobility Overal bed mobility: Needs Assistance Bed Mobility: Supine to Sit     Supine to sit: Total assist        Transfers                    Ambulation/Gait                Stairs            Wheelchair Mobility    Modified Rankin (Stroke Patients Only)       Balance Overall balance assessment: Needs assistance Sitting-balance support: Feet unsupported Sitting balance-Leahy Scale: Zero                                       Pertinent Vitals/Pain      Home Living Family/patient expects to be discharged to:: Skilled nursing facility                      Prior Function           Comments: Unknown if able to participate with mobility     Hand Dominance        Extremity/Trunk Assessment   Upper Extremity Assessment: RUE deficits/detail;LUE deficits/detail RUE Deficits / Details: flexion contracture with no active movement noted     LUE Deficits / Details: spontaneous active movement noted   Lower Extremity Assessment: RLE deficits/detail;LLE deficits/detail RLE Deficits / Details: No active movement noted and extension contracture LLE Deficits / Details: No active movement noted. Extensor foot contracture.     Communication      Cognition Arousal/Alertness: Awake/alert Behavior During Therapy: Flat affect Overall Cognitive Status: No family/caregiver present to determine baseline  cognitive functioning (Pt nonverbal and dementia prior to adm)                      General Comments      Exercises        Assessment/Plan    PT Assessment Patent does not need any further PT services  PT Diagnosis Altered mental status   PT Problem List    PT Treatment Interventions     PT Goals (Current goals can be found in the Care Plan section) Acute Rehab PT Goals PT Goal Formulation: Patient unable to participate in goal setting    Frequency     Barriers to discharge        Co-evaluation               End of Session     Patient left: in bed;with bed alarm set;with call bell/phone within reach           Time: 1555-1606 PT Time Calculation (min) (ACUTE ONLY): 11 min   Charges:   PT Evaluation $Initial PT Evaluation Tier I: 1 Procedure     PT G Codes:  Kreed Kauffman 12/01/2014, 5:28 PM  [facility] PT [phone]

## 2014-12-01 NOTE — Progress Notes (Addendum)
[facility]                                                                              Patient Demographics  Katie House, is a 74 y.o. female, DOB - 12/28/1940, ZOX:[id number]  Admit date - 11/28/2014   Admitting Physician Bobette Mo, MD  Outpatient Primary MD for the patient is Katie House, Katie Pigg, MD  LOS -    Chief Complaint  Patient presents with  . Altered Mental Status       Brief HPI   Per Dr. Robb House on 11/29/14 Katie House is a 74 y.o. female with a past medical history of dementia, type 2 diabetes, stroke with right-sided spasticity, glaucoma, hypertension, hyperlipidemia, depression who is brought from his skilled nursing facility due to several days of increased lethargy, inability to interact and follow commands. Patient was recently treated for UTI and pneumonia at the nursing home. Per family members, there has not been documented fever recently at the nursing home facility.    Assessment & Plan    Principal Problem:   Acute encephalopathy with underlying history of dementia, type 2 diabetes, prior stroke with right-sided spasticity. -Overall improving, does not follow verbal commands. Discussed in detail with the daughter, at baseline, since the stroke, patient has been nonverbal occasionally says yes and no, is on mechanical soft diet, bed bound.   - CBC, BMET unremarkable, creatinine was 1.3 at the time of admission, now 1.1  - Ammonia level 20. Lactic acid normal. TSH normal 0.83. UA negative for any UTI - CT head showed no acute intracranial abnormalities, redemonstration of extensive encephalomalacia of the left hemisphere with stent in the proximal left MCA. - Chest x-ray negative. - Neurology following, also reviewed the records from [place], admission from 2006 when she had the stroke, MCA stents were placed. I called Katie House, discussed with the MRI department and interventional radiology, they said most of the stents  placed at [facility] are MRI compatible, patient should have the implant card which states if the stent is MRI compatible or not. I discussed this with patient's daughter, Katie House who said she will not be able to find the card as it was 10 years ago. Patient's daughter works in the [employer] and will try to find the information as well. IR dept at [facility] will call me if they are able to get the information regarding MRI compatibility of the stent. -EEG showed diffuse continuous theta slowing, indicative of mild to moderate nonspecific encephalopathy,  no electrographic seizures - I also discussed with the patient's daughter that patient's mental status is improving, currently appears to be at baseline. If we are not able to obtain the MRI, and no further workup is recommended by neurology, likely DC in a.m. to skilled nursing facility. Addendum 12:09pm I was called by Katie House, talked to Katie House, per the info, patient had left carotid and left MCA stents placed on 06/20/2004. They are WING SPAN stents and ARE MRI compatible. Discussed with neurology, Dr. Roseanne Reno, recommended MRI brain without contrast, ordered   Addendum: 3:40PM  Radiology  dept unable to do the MRI without  actual documentation of make/model of the stents and the shunt. I was not able to reach anyone in the [facility] IR again if they can fax the documentation. I have updated the daughter.    Active Problems:   Dementia -Restart all meds   Seizure disorder: patient is on Keppra and Lamictal prior to admission:  - Continue Keppra, increased to 750 mg q 12 hours by neurology - Restart Lamictal if okay with neurology - EEG did not show any seizures consistent with diffuse slowing, nonspecific encephalopathy    Diabetes mellitus (HCC) - Blood sugars controlled, continue to hold her Levemir, continue sliding scale insulin  Hypertension - Restart amlodipine   prior CVA with right-sided spasticity  - Not on any  antiplatelet agents PTA  Code Status: Full CODE STATUS   family Communication: discussed with patient's daughter, updated all the labs, imaging and plan of care   Disposition Plan: DC to skilled nursing facility in a.m.  Time Spent in minutes  25 minutes  Procedures  CT head EEG  Consults   None   DVT Prophylaxis  Lovenox   Medications  Scheduled Meds: . enoxaparin (LOVENOX) injection  30 mg Subcutaneous Q24H  . feeding supplement (GLUCERNA SHAKE)  237 mL Oral BID BM  . insulin aspart  0-9 Units Subcutaneous 6 times per day  . levETIRAcetam  750 mg Intravenous Q12H  . polyvinyl alcohol  1 drop Both Eyes BID  . sodium chloride  3 mL Intravenous Q12H  . tobramycin   Left Eye QID   Continuous Infusions:   PRN Meds:.   Antibiotics   Anti-infectives    None        Subjective:   Katie House was seen and examined today.  Alert, smiling, does not communicate, nonverbal at baseline. Unable to obtain any ROS from patient.. No fevers. Per patient's daughter, who was at bedside yesterday evening, stated that she is close to her baseline mental status.   Objective:   Blood pressure 165/94, pulse 63, temperature 98.2 F (36.8 C), temperature source Oral, resp. rate 18, height 5' 7.2" (1.707 m), weight 54.7 kg (120 lb 9.5 oz), SpO2 98 %.  Wt Readings from Last 3 Encounters:  11/29/14 54.7 kg (120 lb 9.5 oz)  06/10/13 57.425 kg (126 lb 9.6 oz)  08/26/12 57.063 kg (125 lb 12.8 oz)     Intake/Output Summary (Last 24 hours) at 12/01/14 1141 Last data filed at 12/01/14 0909  Gross per 24 hour  Intake  107.5 ml  Output      0 ml  Net  107.5 ml    Exam  General: Does not follow any commandsNormal nonverbal at baseline   HEENT:  PERRLA, EOMI, Anicteric Sclera, mucous membranes moist.   Neck: Supple, no JVD, no masses  CVS: S1 S2 clear, RRR  Respiratory: CTAB  Abdomen: Soft, nontender, nondistended, + bowel sounds  Ext: no cyanosis clubbing, both lower  extremity in heel protectors  Neuro: right upper extremity spasticity, does not follow commands  Skin: No rashes  Psych:  does not follow any commands   Data Review   Micro Results Recent Results (from the past 240 hour(s))  MRSA PCR Screening     Status: None   Collection Time: 11/29/14  2:08 AM  Result Value Ref Range Status   MRSA by PCR NEGATIVE NEGATIVE Final    Comment:        The GeneXpert MRSA Assay (FDA approved for NASAL specimens only), is one component of  a comprehensive MRSA colonization surveillance program. It is not intended to diagnose MRSA infection nor to guide or monitor treatment for MRSA infections.     Radiology Reports Ct Head Wo Contrast  11/28/2014  CLINICAL DATA:  74 year old female with a history of altered mental status, increased lethargy. Prior stroke. EXAM: CT HEAD WITHOUT CONTRAST TECHNIQUE: Contiguous axial images were obtained from the base of the skull through the vertex without intravenous contrast. COMPARISON:  CT 03/24/2014 FINDINGS: Unremarkable appearance of the calvarium without acute fracture or aggressive lesion. Unremarkable appearance of the scalp soft tissues. Unremarkable appearance of the bilateral orbits. Persistent dislocation of the left mandibular condyles. Mastoid air cells are clear. No significant paranasal sinus disease Re- demonstration of extensive encephalomalacia of the left hemisphere. Ex vacuo dilation of the left ventricular system. Intracranial stent of the proximal left MCA. No acute intracranial hemorrhage. No midline shift. No mass effect. Gray-white differentiation of the posterior fossa and the right cerebral hemisphere relatively maintained. Hypodensity in the right-sided periventricular white matter. IMPRESSION: No CT evidence of acute intracranial abnormality. Re- demonstration of extensive encephalomalacia of the left hemisphere with stent in the proximal left MCA. Signed, Yvone Neu. Loreta Ave, DO [facility] Electronically Signed   By: Gilmer Mor D.O.   On: 11/28/2014 21:37   Dg Chest Portable 1 View  11/28/2014  CLINICAL DATA:  Altered mental status, diabetes mellitus, hypertension, prior stroke EXAM: PORTABLE CHEST 1 VIEW COMPARISON:  Portable exam 2023 hours without priors for comparison. FINDINGS: Patient's RIGHT arm is contracted and obscures the RIGHT lung base. Patient's chin obscures the RIGHT apex. Normal heart size, mediastinal contours and pulmonary vascularity. Visualized lungs clear. No gross pleural effusion or pneumothorax. IMPRESSION: No definite acute abnormalities within limitations of exam. Electronically Signed   By: Ulyses Southward M.D.   On: 11/28/2014 20:33    CBC  Recent Labs Lab 11/28/14 2043 11/29/14 0558 11/30/14 0543 12/01/14 0550  WBC 10.3 8.6 7.8 7.9  HGB 12.4 11.0* 9.7* 10.3*  HCT 38.9 34.4* 30.2* 32.4*  PLT 262 240 219 236  MCV 85.7 86.4 85.3 84.4  MCH 27.3 27.6 27.4 26.8  MCHC 31.9 32.0 32.1 31.8  RDW 15.9* 16.1* 15.7* 15.5    Chemistries   Recent Labs Lab 11/28/14 2043 11/29/14 0558 11/30/14 0543 12/01/14 0550  NA 138 136 133* 137  K 4.6 4.7 4.2 3.9  CL 107 104 103 106  CO2 21* 23 20* 18*  GLUCOSE 148* 162* 128* 97  BUN 30* 29* 20 12  CREATININE 1.37* 1.29* 1.18* 1.13*  CALCIUM 9.8 9.2 9.0 9.1  MG  --  2.0  --   --   AST 20 20  --   --   ALT 28 25  --   --   ALKPHOS 85 81  --   --   BILITOT 0.7 0.9  --   --    ------------------------------------------------------------------------------------------------------------------ estimated creatinine clearance is 37.7 mL/min (by C-G formula based on Cr of 1.13). ------------------------------------------------------------------------------------------------------------------  Recent Labs  11/29/14 1640  HGBA1C 8.8*    ------------------------------------------------------------------------------------------------------------------ No results for input(s): CHOL, HDL, LDLCALC, TRIG, CHOLHDL, LDLDIRECT in the last 72 hours. ------------------------------------------------------------------------------------------------------------------  Recent Labs  11/29/14 0558  TSH 0.836   ------------------------------------------------------------------------------------------------------------------  Recent Labs  11/29/14 1640  VITAMINB12 937*  FOLATE 68.4    Coagulation profile No results for input(s): INR, PROTIME in the last 168 hours.  No results for input(s): DDIMER in the last 72 hours.  Cardiac Enzymes No results for input(s): CKMB, TROPONINI, MYOGLOBIN in the last 168 hours.  Invalid input(s): CK ------------------------------------------------------------------------------------------------------------------ Invalid input(s): POCBNP   Recent Labs  11/30/14 1153 11/30/14 1612 11/30/14 2049 12/01/14 0111 12/01/14 0439 12/01/14 0832  GLUCAP 92 86 85 87 93 85     Morganne Haile M.D. [facility] 12/01/2014, 11:41 AM  Pager: [phone] Between 7am to 7pm - call Pager - [phone]  After 7pm go to [email or web address] - password [email or web address]  Call night coverage person covering after 7pm

## 2014-12-02 LAB — BASIC METABOLIC PANEL
ANION GAP: 11 (ref 5–15)
BUN: 10 mg/dL (ref 6–20)
CALCIUM: 9.6 mg/dL (ref 8.9–10.3)
CO2: 20 mmol/L — ABNORMAL LOW (ref 22–32)
Chloride: 108 mmol/L (ref 101–111)
Creatinine, Ser: 1.11 mg/dL — ABNORMAL HIGH (ref 0.44–1.00)
GFR calc Af Amer: 55 mL/min — ABNORMAL LOW (ref >60)
GFR, EST NON AFRICAN AMERICAN: 48 mL/min — AB (ref >60)
GLUCOSE: 112 mg/dL — AB (ref 65–99)
Potassium: 4 mmol/L (ref 3.5–5.1)
Sodium: 139 mmol/L (ref 135–145)

## 2014-12-02 LAB — GLUCOSE, CAPILLARY
GLUCOSE-CAPILLARY: 141 mg/dL — AB (ref 65–99)
Glucose-Capillary: 122 mg/dL — ABNORMAL HIGH (ref 65–99)
Glucose-Capillary: 128 mg/dL — ABNORMAL HIGH (ref 65–99)
Glucose-Capillary: 132 mg/dL — ABNORMAL HIGH (ref 65–99)
Glucose-Capillary: 139 mg/dL — ABNORMAL HIGH (ref 65–99)
Glucose-Capillary: 80 mg/dL (ref 65–99)

## 2014-12-02 LAB — CBC
HEMATOCRIT: 35.5 % — AB (ref 36.0–46.0)
HEMOGLOBIN: 11.4 g/dL — AB (ref 12.0–15.0)
MCH: 27 pg (ref 26.0–34.0)
MCHC: 32.1 g/dL (ref 30.0–36.0)
MCV: 84.1 fL (ref 78.0–100.0)
Platelets: 241 10*3/uL (ref 150–400)
RBC: 4.22 MIL/uL (ref 3.87–5.11)
RDW: 15.7 % — ABNORMAL HIGH (ref 11.5–15.5)
WBC: 8.7 10*3/uL (ref 4.0–10.5)

## 2014-12-02 MED ORDER — LEVETIRACETAM 750 MG PO TABS
750.0000 mg | ORAL_TABLET | Freq: Two times a day (BID) | ORAL | Status: DC
  Administered 2014-12-02 – 2014-12-03 (×2): 750 mg via ORAL
  Filled 2014-12-02 (×2): qty 1

## 2014-12-02 MED ORDER — LEVETIRACETAM 750 MG PO TABS: 750.0000 mg | ORAL_TABLET | Freq: Two times a day (BID) | ORAL | Status: AC

## 2014-12-02 MED ORDER — GLUCERNA SHAKE PO LIQD: 237.0000 mL | Freq: Two times a day (BID) | ORAL | Status: AC

## 2014-12-02 MED ORDER — TOBRAMYCIN 0.3 % OP OINT: TOPICAL_OINTMENT | Freq: Four times a day (QID) | OPHTHALMIC | Status: AC

## 2014-12-02 NOTE — Clinical Social Work Note (Signed)
Clinical Social Worker continuing to follow patient and family for support and discharge planning needs.  Per MD, patient will be medically ready for discharge on Saturday 10/22.  CSW spoke with admissions coordinator, Lowella Bandyikki, at [facility] who states that patient is able to return over the weekend.  CSW [facility] to facility and ambulance transport will be need to be arranged.  CSW remains available for support and to facilitate patient discharge needs once medically stable.  Macario GoldsJesse Annaly Skop, [place] [phone]

## 2014-12-02 NOTE — Progress Notes (Signed)
IV access lost. IV team paged. IV team unable to gain IV access. Made Dr Isidoro Donningai aware of no IV access and poor diet. Dr Isidoro Donningai said its okay to leave IV out, and placed order for SLP and dietician. Will make SLP aware. Will continue to monitor pt throughout day. Bed remains in lowest position.

## 2014-12-02 NOTE — Progress Notes (Addendum)
Subjective: patient in bed, looking down, both eyes open, follows no commands.   Objective: Current vital signs: BP 128/72 mmHg  Pulse 114  Temp(Src) 98.8 F (37.1 C) (Oral)  Resp 14  Ht 5' 7.2" (1.707 m)  Wt 54.7 kg (120 lb 9.5 oz)  BMI 18.77 kg/m2  SpO2 95% Vital signs in last 24 hours: Temp:  [96.1 F (35.6 C)-98.8 F (37.1 C)] 98.8 F (37.1 C) (10/21 0841) Pulse Rate:  [47-114] 114 (10/21 0631) Resp:  [14-19] 14 (10/21 0841) BP: (128-185)/(52-82) 128/72 mmHg (10/21 0841) SpO2:  [94 %-100 %] 95 % (10/21 0455)  Intake/Output from previous day: 10/20 0701 - 10/21 0700 In: 210.5 [P.O.:100; I.V.:3; IV Piggyback:107.5] Out: -  Intake/Output this shift:   Nutritional status: DIET DYS 3 Room service appropriate?: Yes; Fluid consistency:: Thin  Neurologic Exam: General: NAD Mental Status: Awake, both eyes open, blinking multiple times but closes eyes shut when attempting to look at pupils.  Cranial Nerves: II: blinks to threat bialterally, pupils 3 mm equal, non reactive to light  III,IV, VI: ptosis not present, forces eyes shut when attempting dolls maneuver V,VII: face symmetric, winces to pain bilaterally VIII: winces to loud sounds IX,X: unable to assess XI: unable to assess XII: unable to assess  Motor: Increased tone throughout, right arm held in flexion contracture, attempting to reach for me with left hand. Both legs held in extension with increased tone.  Sensory: winces to pain on the left.  Deep Tendon Reflexes:  3+ LUE no DTR in the RUE, 1+ bilateral KJ no AJ     Lab Results: Basic Metabolic Panel:  Recent Labs Lab 11/28/14 2043 11/29/14 0558 11/30/14 0543 12/01/14 0550 12/02/14 0537  NA 138 136 133* 137 139  K 4.6 4.7 4.2 3.9 4.0  CL 107 104 103 106 108  CO2 21* 23 20* 18* 20*  GLUCOSE 148* 162* 128* 97 112*  BUN 30* 29* CREATININE 1.37* 1.29* 1.18* 1.13* 1.11*  CALCIUM 9.8 9.2 9.0 9.1 9.6  MG  --  2.0  --   --   --   PHOS  --   3.8  --   --   --     Liver Function Tests:  Recent Labs Lab 11/28/14 2043 11/29/14 0558  AST 20 20  ALT 28 25  ALKPHOS 85 81  BILITOT 0.7 0.9  PROT 7.4 6.8  ALBUMIN 3.8 3.4*   No results for input(s): LIPASE, AMYLASE in the last 168 hours.  Recent Labs Lab 11/29/14 0558  AMMONIA 20    CBC:  Recent Labs Lab 11/28/14 2043 11/29/14 0558 11/30/14 0543 12/01/14 0550 12/02/14 0537  WBC 10.3 8.6 7.8 7.9 8.7  HGB 12.4 11.0* 9.7* 10.3* 11.4*  HCT 38.9 34.4* 30.2* 32.4* 35.5*  MCV 85.7 86.4 85.3 84.4 84.1  PLT 262 240 219 236 241    Cardiac Enzymes: No results for input(s): CKTOTAL, CKMB, CKMBINDEX, TROPONINI in the last 168 hours.  Lipid Panel: No results for input(s): CHOL, TRIG, HDL, CHOLHDL, VLDL, LDLCALC in the last 168 hours.  CBG:  Recent Labs Lab 12/01/14 1726 12/01/14 2009 12/02/14 0013 12/02/14 0453 12/02/14 0757  GLUCAP 86 147* 128* 80 141*    Microbiology: Results for orders placed or performed during the hospital encounter of 11/28/14  MRSA PCR Screening     Status: None   Collection Time: 11/29/14  2:08 AM  Result Value Ref Range Status   MRSA by PCR NEGATIVE NEGATIVE Final  Comment:        The GeneXpert MRSA Assay (FDA approved for NASAL specimens only), is one component of a comprehensive MRSA colonization surveillance program. It is not intended to diagnose MRSA infection nor to guide or monitor treatment for MRSA infections.     Coagulation Studies: No results for input(s): LABPROT, INR in the last 72 hours.  Imaging: No results found.  Medications:  Scheduled: . amLODipine  10 mg Oral Daily  . enoxaparin (LOVENOX) injection  30 mg Subcutaneous Q24H  . feeding supplement (GLUCERNA SHAKE)  237 mL Oral BID BM  . hydrALAZINE  10 mg Oral TID  . insulin aspart  0-9 Units Subcutaneous 6 times per day  . lamoTRIgine  150 mg Oral Daily  . levETIRAcetam  750 mg Intravenous Q12H  . mirtazapine  15 mg Oral QHS  . polyvinyl  alcohol  1 drop Both Eyes BID  . sodium chloride  3 mL Intravenous Q12H  . tobramycin   Left Eye QID    Assessment/Plan:  Acute encephalopathy. EEG showing no seizure activity. Awaiting information on patients stents/clips and shunt to see if MRI compatible.  If so will obtain MRI brain.   Recommend: 1) At this time MRI would not change patient plan of treatment and likely clinical outcome, and decision to cancel MRI has been made.  2) Keppra 750 mg BID 3) home dose Lamictal  4) Continue to hold CNS active Meds other than AED.    Felicie MornDavid Smith PA-C [facility] [phone]  12/02/2014, 9:43 AM   I personally participated in this patient's evaluation and management, including formulating the above clinical impression and management recommendations.  Venetia MaxonR Elaya Droege M.D. [facility] [phone]

## 2014-12-02 NOTE — Progress Notes (Signed)
Nutrition Follow-up  DOCUMENTATION CODES:   Not applicable  INTERVENTION:   -D/c Glucerna Shake due to poor acceptance -Magic Cup TID with meals -If pt remains too lethargic to sustain adequate nutrition, consider initiation of nutrition support. Recommend:  Initiate Jevity 1.2 @ 20 ml/hr and increase by 10 ml every 4 hours to goal rate of 55 ml/hr.    Tube feeding regimen provides 1584 kcal (100% of needs), 73 grams of protein, and 1065 ml of H2O.   NUTRITION DIAGNOSIS:   Inadequate oral intake related to lethargy/confusion as evidenced by meal completion < 25%.  Ongoing  GOAL:   Patient will meet greater than or equal to 90% of their needs  Unmet  MONITOR:   PO intake, Supplement acceptance, Labs, Weight trends, Skin, I & O's  REASON FOR ASSESSMENT:   Low Braden    ASSESSMENT:   Katie House is a 74 y.o. female with a past medical history of dementia, type 2 diabetes, stroke with right-sided spasticity, glaucoma, hypertension, hyperlipidemia, depression who is brought from his skilled nursing facility due to several days of increased lethargy, inability to interact and follow commands. Patient was recently treated for UTI and pneumonia at the nursing home. Per family members, there has not been documented fever recently at the nursing home facility.  RD received consult for poor po intake.   Intake has been poor since admission (PO: 0-25%). Pt is currently on a dysphagia 3 diet; SLP evaluation pending.   Pt not alert at time of visit; did not waken for RD's questions. Reviewed breakfast tray on bedside table. Pt consumed only about 25-50% of orange juice. Suspect pt's mentation is main contributing factor to poor po intake.   Per doc flowsheets, pt is refusing Glucerna supplements. RD will d/c due to poor acceptance.   Discharge disposition is to return to SNF once medically stable.   Labs reviewed.  Diet Order:  DIET DYS 3 Room service appropriate?: Yes; Fluid  consistency:: Thin  Skin:  Reviewed, no issues  Last BM:  11/30/14  Height:   Ht Readings from Last 1 Encounters:  11/29/14 5' 7.2" (1.707 m)    Weight:   Wt Readings from Last 1 Encounters:  11/29/14 120 lb 9.5 oz (54.7 kg)    Ideal Body Weight:  61.4 kg  BMI:  Body mass index is 18.77 kg/(m^2).  Estimated Nutritional Needs:   Kcal:  1400-1600  Protein:  60-75 grams  Fluid:  1.4-1.6 L  EDUCATION NEEDS:   No education needs identified at this time  Dyneisha Murchison A. Mayford KnifeWilliams, RD, LDN, CDE Pager: [phone] After hours Pager: [phone]

## 2014-12-02 NOTE — Discharge Summary (Signed)
Physician Discharge Summary   Patient ID: Katie House MRN: [id number] DOB/AGE: 74/03/1940 74 y.o.  Admit date: 11/28/2014 Discharge date: 12/03/2014  Primary Care Physician:  Angela Coxasanayaka, Gayani Y, MD  Discharge Diagnoses:   . Acute encephalopathy . Seizure disorder  . Dementia . Essential hypertension, benign . Hyperlipemia . Depression  Consults: Neurology, Dr. Roseanne RenoStewart   Recommendations for Outpatient Follow-up:  Keppra increased to 750 mg twice a day Continue Lamictal at the same dose 150 mg daily   TESTS THAT NEED FOLLOW-UP None   DIET: Dysphagia 3 diet, thin liquids    Allergies:   Allergies  Allergen Reactions  . Latex     unknown     Discharge Medications:   Medication List    STOP taking these medications        amoxicillin 875 MG tablet  Commonly known as:  AMOXIL     insulin detemir 100 UNIT/ML injection  Commonly known as:  LEVEMIR     losartan 100 MG tablet  Commonly known as:  COZAAR      TAKE these medications        amLODipine 10 MG tablet  Commonly known as:  NORVASC  Take 10 mg by mouth daily.     DULoxetine 60 MG capsule  Commonly known as:  CYMBALTA  Take 60 mg by mouth daily.     feeding supplement (GLUCERNA SHAKE) Liqd  Take 237 mLs by mouth 2 (two) times daily between meals.     hydrALAZINE 10 MG tablet  Commonly known as:  APRESOLINE  Take 10 mg by mouth 3 (three) times daily.     insulin aspart 100 UNIT/ML injection  Commonly known as:  novoLOG  Inject 2-8 Units into the skin 3 (three) times daily before meals. 61-200=0 units, 201-250=2 units, 251-300=4 units, 301-350=6 units, 351-400=8 units if above 400 give 8 units and notify provider.     lamoTRIgine 150 MG tablet  Commonly known as:  LAMICTAL  Take 150 mg by mouth daily. Notify provider immediately if patient develops rash     levETIRAcetam 750 MG tablet  Commonly known as:  KEPPRA  Take 1 tablet (750 mg total) by mouth 2 (two) times daily.     loratadine 10 MG tablet  Commonly known as:  CLARITIN  Take 10 mg by mouth daily. For allergies     methylcellulose 1 % ophthalmic solution  Commonly known as:  ARTIFICIAL TEARS  Place 1 drop into both eyes 2 (two) times daily. For dry eyes     MIRALAX powder  Generic drug:  polyethylene glycol powder  Take 17 g by mouth daily. For constipation     mirtazapine 15 MG tablet  Commonly known as:  REMERON  Take 15 mg by mouth at bedtime.     saccharomyces boulardii 250 MG capsule  Commonly known as:  FLORASTOR  Take 250 mg by mouth 2 (two) times daily. Take for 10 days start date on 11-08-14     tobramycin 0.3 % ophthalmic ointment  Commonly known as:  TOBREX  Place into the left eye 4 (four) times daily. X 5days     traMADol 50 MG tablet  Commonly known as:  ULTRAM  Take 50 mg by mouth daily.         Brief H and P: For complete details please refer to admission H and P, but in brief,  Katie House is a 74 y.o. female with a past medical history of dementia, type 2 diabetes, stroke  with right-sided spasticity, glaucoma, hypertension, hyperlipidemia, depression who is brought from his skilled nursing facility due to several days of increased lethargy, inability to interact and follow commands. Patient was recently treated for UTI and pneumonia at the nursing home. Per family members, there has not been documented fever recently at the nursing home facility.   Hospital Course:   Acute encephalopathy with underlying history of dementia, type 2 diabetes, prior stroke with right-sided spasticity. -Overall improving, does not follow verbal commands. Discussed in detail with the daughter, at baseline, since the stroke, patient has been nonverbal, occasionally says yes and no, is on mechanical soft diet, bed bound.  - CBC, BMET unremarkable at the time of admission, creatinine was 1.3 at the time of admission, now 1.1  - Ammonia level 20. Lactic acid normal. TSH normal 0.83. UA  negative for any UTI - CT head showed no acute intracranial abnormalities, redemonstration of extensive encephalomalacia of the left hemisphere with stent in the proximal left MCA. Chest x-ray negative. -EEG showed diffuse continuous theta slowing, indicative of mild to moderate nonspecific encephalopathy, no electrographic seizures - Neurology was consulted. Reviewed the records from [place], admission from 2006 when she had the stroke, left carotid and left MCA stents were placed in May 2006. I called [facility] on 10/20, discussed with the MRI department and interventional radiology, the carotid and MCA stents are MRI compatible however the MRI department at [facility] declined to do the MRI without actual documentation of the stents and the shunt. Patient's daughter is unable to find the information card regarding the stents. -Per Neurology, at this time MRI of the brain will not change the patient management and decision has been made to forgo the MRI. Neurology recommended to continue Keppra and Lamictal. Keppra was increased to 750 mg twice a day during this admission. - I also discussed with the patient's daughter that patient's mental status is improving, currently appears to be close to baseline, she requested a swallow evaluation. Patient will be seen by nutritionist, speech therapist prior to the discharge. Physical therapy evaluation recommended skilled nursing facility.     Dementia - Continue Remeron   Seizure disorder: patient is on Keppra and Lamictal prior to admission:  - EEG did not show any seizures consistent with diffuse slowing, nonspecific encephalopathy - Changed to oral Keppra, Lamictal. Keppra was increased to 750 mg twice a day.   Diabetes mellitus (HCC) - Blood sugars controlled, continue to hold her Levemir, continue sliding scale insulin  Hypertension: Controlled - Restart amlodipine  prior CVA with right-sided spasticity  - Not on any antiplatelet agents  PTA  Dysphagia - Prior to hospitalization, patient was on dysphagia 3 diet. She is swallowing pills. Patient will be seen by speech therapist prior to discharge.   Day of Discharge BP 128/72 mmHg  Pulse 114  Temp(Src) 98.8 F (37.1 C) (Oral)  Resp 14  Ht 5' 7.2" (1.707 m)  Wt 54.7 kg (120 lb 9.5 oz)  BMI 18.77 kg/m2  SpO2 95%  Physical Exam:   General: nonverbal, does not follow commands   HEENT: PERRLA, EOMI, Anicteric Sclera,  Neck: Supple, no JVD, no masses  CVS: S1 S2 clear, RRR  Respiratory: CTAB  Abdomen: Soft, NT, NBS, ND  Ext: no cyanosis clubbing, both lower extremity in heel protectors  Neuro: right upper extremity spasticity/ contractures, does not follow commands  Skin: No rashes  Psych: does not follow any commands  The results of significant diagnostics from this hospitalization (including imaging,  microbiology, ancillary and laboratory) are listed below for reference.    LAB RESULTS: Basic Metabolic Panel:  Recent Labs Lab 11/29/14 0558  12/01/14 0550 12/02/14 0537  NA 136  < > 137 139  K 4.7  < > 3.9 4.0  CL 104  < > 106 108  CO2 23  < > 18* 20*  GLUCOSE 162*  < > 97 112*  BUN 29*  < > 12 10  CREATININE 1.29*  < > 1.13* 1.11*  CALCIUM 9.2  < > 9.1 9.6  MG 2.0  --   --   --   PHOS 3.8  --   --   --   < > = values in this interval not displayed. Liver Function Tests:  Recent Labs Lab 11/28/14 2043 11/29/14 0558  AST 20 20  ALT 28 25  ALKPHOS 85 81  BILITOT 0.7 0.9  PROT 7.4 6.8  ALBUMIN 3.8 3.4*   No results for input(s): LIPASE, AMYLASE in the last 168 hours.  Recent Labs Lab 11/29/14 0558  AMMONIA 20   CBC:  Recent Labs Lab 12/01/14 0550 12/02/14 0537  WBC 7.9 8.7  HGB 10.3* 11.4*  HCT 32.4* 35.5*  MCV 84.4 84.1  PLT 236 241   Cardiac Enzymes: No results for input(s): CKTOTAL, CKMB, CKMBINDEX, TROPONINI in the last 168 hours. BNP: Invalid input(s): POCBNP CBG:  Recent Labs Lab 12/02/14 0453  12/02/14 0757  GLUCAP 80 141*    Significant Diagnostic Studies:  Ct Head Wo Contrast  11/28/2014  CLINICAL DATA:  74 year old female with a history of altered mental status, increased lethargy. Prior stroke. EXAM: CT HEAD WITHOUT CONTRAST TECHNIQUE: Contiguous axial images were obtained from the base of the skull through the vertex without intravenous contrast. COMPARISON:  CT 03/24/2014 FINDINGS: Unremarkable appearance of the calvarium without acute fracture or aggressive lesion. Unremarkable appearance of the scalp soft tissues. Unremarkable appearance of the bilateral orbits. Persistent dislocation of the left mandibular condyles. Mastoid air cells are clear. No significant paranasal sinus disease Re- demonstration of extensive encephalomalacia of the left hemisphere. Ex vacuo dilation of the left ventricular system. Intracranial stent of the proximal left MCA. No acute intracranial hemorrhage. No midline shift. No mass effect. Gray-white differentiation of the posterior fossa and the right cerebral hemisphere relatively maintained. Hypodensity in the right-sided periventricular white matter. IMPRESSION: No CT evidence of acute intracranial abnormality. Re- demonstration of extensive encephalomalacia of the left hemisphere with stent in the proximal left MCA. Signed, Yvone Neu. Loreta Ave, DO [facility] Radiology Electronically Signed   By: Gilmer Mor D.O.   On: 11/28/2014 21:37   Dg Chest Portable 1 View  11/28/2014  CLINICAL DATA:  Altered mental status, diabetes mellitus, hypertension, prior stroke EXAM: PORTABLE CHEST 1 VIEW COMPARISON:  Portable exam 2023 hours without priors for comparison. FINDINGS: Patient's RIGHT arm is contracted and obscures the RIGHT lung base. Patient's chin obscures the RIGHT apex. Normal heart size, mediastinal contours and pulmonary vascularity. Visualized lungs clear. No gross pleural effusion or pneumothorax.  IMPRESSION: No definite acute abnormalities within limitations of exam. Electronically Signed   By: Ulyses Southward M.D.   On: 11/28/2014 20:33    2D ECHO:   Disposition and Follow-up:    DISPOSITION: Skilled nursing facility   DISCHARGE FOLLOW-UP Follow-up Information    Follow up with Margit Hanks, MD. Schedule an appointment as soon as possible for a visit in 2 weeks.   Specialty:  Internal Medicine  Why:  for hospital follow-up   Contact information:   [address] [place] [address] [phone]        Time spent on Discharge: 35 minutes  Signed:   RAI,RIPUDEEP M.D. [facility] 12/02/2014, 11:45 AM Pager: [phone]

## 2014-12-02 NOTE — Progress Notes (Signed)
[facility]                                                                              Patient Demographics  Katie House, is a 74 y.o. female, DOB - 07/04/1940, WNU:[id number]  Admit date - 11/28/2014   Admitting Physician Bobette Mo, MD  Outpatient Primary MD for the patient is Dasanayaka, Newton Pigg, MD  LOS -    Chief Complaint  Patient presents with  . Altered Mental Status       Brief HPI   Per Dr. Robb Matar on 11/29/14 Katie House is a 74 y.o. female with a past medical history of dementia, type 2 diabetes, stroke with right-sided spasticity, glaucoma, hypertension, hyperlipidemia, depression who is brought from his skilled nursing facility due to several days of increased lethargy, inability to interact and follow commands. Patient was recently treated for UTI and pneumonia at the nursing home. Per family members, there has not been documented fever recently at the nursing home facility.    Assessment & Plan    Principal Problem:   Acute encephalopathy with underlying history of dementia, type 2 diabetes, prior stroke with right-sided spasticity. -Overall improving, does not follow verbal commands. Discussed in detail with the daughter, at baseline, since the stroke, patient has been nonverbal occasionally says yes and no, is on mechanical soft diet, bed bound.   - CBC, BMET unremarkable, creatinine was 1.3 at the time of admission, now 1.1  - Ammonia level 20. Lactic acid normal. TSH normal 0.83. UA negative for any UTI - CT head showed no acute intracranial abnormalities, redemonstration of extensive encephalomalacia of the left hemisphere with stent in the proximal left MCA. Chest x-ray negative. - Neurology following, also reviewed the records from [place], admission from 2006 when she had the stroke, MCA stents were placed. I called [facility], discussed with the MRI department and interventional radiology, the carotid and MCA  stents are MRI compatible however the MRI department at [facility] declined to do the MRI without actual documentation of the stents and the shunt. Patient's daughter is unable to find the information card regarding the stents. -EEG showed diffuse continuous theta slowing, indicative of mild to moderate nonspecific encephalopathy,  no electrographic seizures - Discussed in detail with neurology, , Dr. Roseanne Reno, at this time MRI of the brain will not change the patient management and decision has been made to forego the MRI. Neurology recommended to continue Keppra and Lamictal. Keppra was increased to 750 mg twice a day during this admission. - I also discussed with the patient's daughter that patient's mental status is improving, currently appears to be close to baseline, she requested a swallow evaluation as patient is not eating well according to her baseline. Ordered PT evaluation, SLP and nutrition consult.   Active Problems:   Dementia - Continue Remeron   Seizure disorder: patient is on Keppra and Lamictal prior to admission:  -  EEG did not show any seizures consistent with diffuse slowing, nonspecific encephalopathy - Changed to oral Keppra, Lamictal    Diabetes mellitus (HCC) - Blood sugars controlled, continue to hold her Levemir, continue sliding scale insulin  Hypertension: Controlled - Restart amlodipine   prior CVA with right-sided spasticity  - Not on any antiplatelet agents PTA  Dysphagia - Prior to hospitalization, patient was on dysphagia 3 diet. She is swallowing pills. Will obtain swallow evaluation today  Code Status: Full CODE STATUS   family Communication: discussed with patient's daughter, Ms. Katie House, updated all the labs, imaging and plan of care On phone today. She is requesting patient to be discharged tomorrow morning to SNF.   Disposition Plan: DC to skilled nursing facility in a.m.  Time Spent in minutes  15 minutes  Procedures  CT head EEG  Consults    None   DVT Prophylaxis  Lovenox   Medications  Scheduled Meds: . amLODipine  10 mg Oral Daily  . enoxaparin (LOVENOX) injection  30 mg Subcutaneous Q24H  . feeding supplement (GLUCERNA SHAKE)  237 mL Oral BID BM  . hydrALAZINE  10 mg Oral TID  . insulin aspart  0-9 Units Subcutaneous 6 times per day  . lamoTRIgine  150 mg Oral Daily  . levETIRAcetam  750 mg Oral BID  . mirtazapine  15 mg Oral QHS  . polyvinyl alcohol  1 drop Both Eyes BID  . sodium chloride  3 mL Intravenous Q12H  . tobramycin   Left Eye QID   Continuous Infusions:   PRN Meds:.   Antibiotics   Anti-infectives    None        Subjective:   [name] was seen and examined today. No significant change, does not communicate, nonverbal at baseline. Unable to obtain any ROS from patient. No fevers.   Objective:   Blood pressure 128/72, pulse 114, temperature 98.8 F (37.1 C), temperature source Oral, resp. rate 14, height 5' 7.2" (1.707 m), weight 54.7 kg (120 lb 9.5 oz), SpO2 95 %.  Wt Readings from Last 3 Encounters:  11/29/14 54.7 kg (120 lb 9.5 oz)  06/10/13 57.425 kg (126 lb 9.6 oz)  08/26/12 57.063 kg (125 lb 12.8 oz)     Intake/Output Summary (Last 24 hours) at 12/02/14 1131 Last data filed at 12/01/14 2126  Gross per 24 hour  Intake  210.5 ml  Output      0 ml  Net  210.5 ml    Exam  General:  nonverbal, does not follow commands   HEENT:  PERRLA, EOMI, Anicteric Sclera,  Neck: Supple, no JVD, no masses  CVS: S1 S2 clear, RRR  Respiratory: CTAB  Abdomen: Soft, NT, NBS, ND  Ext: no cyanosis clubbing, both lower extremity in heel protectors  Neuro: right upper extremity spasticity/ contractures, does not follow commands  Skin: No rashes  Psych:  does not follow any commands   Data Review   Micro Results Recent Results (from the past 240 hour(s))  MRSA PCR Screening     Status: None   Collection Time: 11/29/14  2:08 AM  Result Value Ref Range Status   MRSA by  PCR NEGATIVE NEGATIVE Final    Comment:        The GeneXpert MRSA Assay (FDA approved for NASAL specimens only), is one component of a comprehensive MRSA colonization surveillance program. It is not intended to diagnose MRSA infection nor to guide or monitor treatment for MRSA infections.     Radiology Reports Ct Head Wo Contrast  11/28/2014  CLINICAL DATA:  74 year old female with a history of altered mental status, increased lethargy. Prior stroke. EXAM: CT HEAD WITHOUT CONTRAST TECHNIQUE: Contiguous axial images were obtained from the  base of the skull through the vertex without intravenous contrast. COMPARISON:  CT 03/24/2014 FINDINGS: Unremarkable appearance of the calvarium without acute fracture or aggressive lesion. Unremarkable appearance of the scalp soft tissues. Unremarkable appearance of the bilateral orbits. Persistent dislocation of the left mandibular condyles. Mastoid air cells are clear. No significant paranasal sinus disease Re- demonstration of extensive encephalomalacia of the left hemisphere. Ex vacuo dilation of the left ventricular system. Intracranial stent of the proximal left MCA. No acute intracranial hemorrhage. No midline shift. No mass effect. Gray-white differentiation of the posterior fossa and the right cerebral hemisphere relatively maintained. Hypodensity in the right-sided periventricular white matter. IMPRESSION: No CT evidence of acute intracranial abnormality. Re- demonstration of extensive encephalomalacia of the left hemisphere with stent in the proximal left MCA. Signed, Yvone Neu. Loreta Ave, DO [facility] Radiology Electronically Signed   By: Gilmer Mor D.O.   On: 11/28/2014 21:37   Dg Chest Portable 1 View  11/28/2014  CLINICAL DATA:  Altered mental status, diabetes mellitus, hypertension, prior stroke EXAM: PORTABLE CHEST 1 VIEW COMPARISON:  Portable exam 2023 hours without priors for comparison.  FINDINGS: Patient's RIGHT arm is contracted and obscures the RIGHT lung base. Patient's chin obscures the RIGHT apex. Normal heart size, mediastinal contours and pulmonary vascularity. Visualized lungs clear. No gross pleural effusion or pneumothorax. IMPRESSION: No definite acute abnormalities within limitations of exam. Electronically Signed   By: Ulyses Southward M.D.   On: 11/28/2014 20:33    CBC  Recent Labs Lab 11/28/14 2043 11/29/14 0558 11/30/14 0543 12/01/14 0550 12/02/14 0537  WBC 10.3 8.6 7.8 7.9 8.7  HGB 12.4 11.0* 9.7* 10.3* 11.4*  HCT 38.9 34.4* 30.2* 32.4* 35.5*  PLT 262 240 219 236 241  MCV 85.7 86.4 85.3 84.4 84.1  MCH 27.3 27.6 27.4 26.8 27.0  MCHC 31.9 32.0 32.1 31.8 32.1  RDW 15.9* 16.1* 15.7* 15.5 15.7*    Chemistries   Recent Labs Lab 11/28/14 2043 11/29/14 0558 11/30/14 0543 12/01/14 0550 12/02/14 0537  NA 138 136 133* 137 139  K 4.6 4.7 4.2 3.9 4.0  CL 107 104 103 106 108  CO2 21* 23 20* 18* 20*  GLUCOSE 148* 162* 128* 97 112*  BUN 30* 29* CREATININE 1.37* 1.29* 1.18* 1.13* 1.11*  CALCIUM 9.8 9.2 9.0 9.1 9.6  MG  --  2.0  --   --   --   AST 20 20  --   --   --   ALT 28 25  --   --   --   ALKPHOS 85 81  --   --   --   BILITOT 0.7 0.9  --   --   --    ------------------------------------------------------------------------------------------------------------------ estimated creatinine clearance is 38.4 mL/min (by C-G formula based on Cr of 1.11). ------------------------------------------------------------------------------------------------------------------  Recent Labs  11/29/14 1640  HGBA1C 8.8*   ------------------------------------------------------------------------------------------------------------------ No results for input(s): CHOL, HDL, LDLCALC, TRIG, CHOLHDL, LDLDIRECT in the last 72 hours. ------------------------------------------------------------------------------------------------------------------ No results for  input(s): TSH, T4TOTAL, T3FREE, THYROIDAB in the last 72 hours.  Invalid input(s): FREET3 ------------------------------------------------------------------------------------------------------------------  Recent Labs  11/29/14 1640  VITAMINB12 937*  FOLATE 68.4    Coagulation profile No results for input(s): INR, PROTIME in the last 168 hours.  No results for input(s): DDIMER in the last 72 hours.  Cardiac Enzymes No results for input(s): CKMB, TROPONINI, MYOGLOBIN in the last 168 hours.  Invalid input(s): CK ------------------------------------------------------------------------------------------------------------------ Invalid input(s): POCBNP  Recent Labs  12/01/14 1610 12/01/14 1726 12/01/14 2009 12/02/14 0013 12/02/14 0453 12/02/14 0757  GLUCAP 92 86 147* 128* 80 141*     Judith Demps M.D. [facility] 12/02/2014, 11:31 AM  Pager: [phone] Between 7am to 7pm - call Pager - [phone]  After 7pm go to [email or web address] - password [email or web address]  Call night coverage person covering after 7pm

## 2014-12-02 NOTE — Evaluation (Signed)
Clinical/Bedside Swallow Evaluation Patient Details  Name: Katie House MRN: [id number] Date of Birth: 01-28-1941  Today's Date: 12/02/2014 Time: SLP Start Time (ACUTE ONLY): 1510 SLP Stop Time (ACUTE ONLY): 1540 SLP Time Calculation (min) (ACUTE ONLY): 30 min  Past Medical History:  Past Medical History  Diagnosis Date  . Dementia   . Diabetes mellitus (HCC)   . High blood pressure   . Glaucoma   . Cataracts, both eyes   . Hyperlipemia   . Depression   . Constipation   . Anemia   . Stroke (HCC)   . Dysphagia, unspecified(787.20)   . Dementia, unspecified, without behavioral disturbance    Past Surgical History: History reviewed. No pertinent past surgical history. HPI:  74 y.o. female with a past medical history of dementia, type 2 diabetes, stroke with right-sided spasticity, glaucoma, hypertension, hyperlipidemia, depression who is brought from his skilled nursing facility due to several days of increased lethargy, inability to interact and follow commands. Patient was recently treated for UTI and pneumonia at the nursing home. Per family members, there has not been documented fever recently at the nursing home facility. Not eating well at baseline at SNF per MD notes. Daughter, Katie House, present for evaluation, and states that her mother requires hand-feeding, is often on thickened liquids at the SNF, but not consistently, and often holds food orally.     Assessment / Plan / Recommendation Clinical Impression  Pt presents with swallowing deficits that are consistent with a dementia-based dysphagia.  She accepted POs offered, anticipating their arrival at mouth, demonstrating oral holding and prolonged oral preparation of solids and liquids.  Swallow response was palpated; there were multiple swallows associated with each bolus, and thin liquids elicited occasional, mild throat-clearing.  There were no obvious signs of aspiration, but given pt's current mental status and dependency on  others for feeding, she is inherently an aspiration risk.  Daughter, Katie House, acknowledged little understanding of dementia and its ultimate impact upon swallowing.  We discussed trajectory of dementia and swallowing; fluctuation in swallowing ability depending on overall medical condition, LOA.  We discussed the benefits/burdens of feeding tubes, and the contraindication of feeding tubes in advanced dementia.  I encouraged Katie House to talk with her siblings and further educate themselves about dementia, swallowing, and TF.  By doing so, if a time comes when they need to make a decision about feeding, they will be prepared.  Katie House agreed with overall results/recs.  Recommend dysphagia 1 diet, thin liquids for now.  Meds crushed in puree.  Careful handfeeding by staff.  Pt for likely D/C back to SNF next date.      Aspiration Risk  Moderate    Diet Recommendation Stage 1 baby food;Thin   Medication Administration: Crushed with puree Compensations: Slow rate;Small sips/bites    Other  Recommendations Oral Care Recommendations: Oral care BID    Swallow Study Prior Functional Status       General Other Pertinent Information: 74 y.o. female with a past medical history of dementia, type 2 diabetes, stroke with right-sided spasticity, glaucoma, hypertension, hyperlipidemia, depression who is brought from his skilled nursing facility due to several days of increased lethargy, inability to interact and follow commands. Patient was recently treated for UTI and pneumonia at the nursing home. Per family members, there has not been documented fever recently at the nursing home facility. Not eating well at baseline at SNF per MD notes. Daughter, Katie House, present for evaluation, and states that her mother requires hand-feeding, is often on  thickened liquids at the SNF, but not consistently, and often holds food orally.   Type of Study: Bedside swallow evaluation Previous Swallow Assessment: none per records Diet  Prior to this Study: Dysphagia 3 (soft);Thin liquids Temperature Spikes Noted: Yes Respiratory Status: Room air History of Recent Intubation: No Behavior/Cognition: Alert;Doesn't follow directions Oral Cavity - Dentition: Adequate natural dentition/normal for age Self-Feeding Abilities: Total assist Patient Positioning: Upright in bed Baseline Vocal Quality: Not observed Volitional Cough: Cognitively unable to elicit Volitional Swallow: Unable to elicit    Oral/Motor/Sensory Function Overall Oral Motor/Sensory Function:  (chronic right CN deficits (V, VII) s/p CVA ten years ago)   [employer] Chips Ice chips: Not tested   Thin Liquid Thin Liquid: Impaired Presentation: Cup;Straw;Spoon Oral Phase Functional Implications: Oral holding;Prolonged oral transit Pharyngeal  Phase Impairments: Suspected delayed Swallow;Multiple swallows;Throat Clearing - Delayed    Nectar Thick Nectar Thick Liquid: Not tested   Honey Thick Honey Thick Liquid: Not tested   Puree Puree: Impaired Presentation: Spoon Oral Phase Functional Implications: Oral residue;Oral holding Pharyngeal Phase Impairments: Suspected delayed Swallow;Multiple swallows   Solid  Katie House Katie House, [place] CCC/SLP Pager [phone]     Solid: Not tested       Katie House, Katie House 12/02/2014,4:17 PM

## 2014-12-03 DIAGNOSIS — G9340 Encephalopathy, unspecified: Principal | ICD-10-CM

## 2014-12-03 LAB — BASIC METABOLIC PANEL
ANION GAP: 16 — AB (ref 5–15)
BUN: 14 mg/dL (ref 6–20)
CALCIUM: 9.9 mg/dL (ref 8.9–10.3)
CHLORIDE: 108 mmol/L (ref 101–111)
CO2: 17 mmol/L — AB (ref 22–32)
CREATININE: 1.3 mg/dL — AB (ref 0.44–1.00)
GFR calc non Af Amer: 39 mL/min — ABNORMAL LOW (ref >60)
GFR, EST AFRICAN AMERICAN: 46 mL/min — AB (ref >60)
Glucose, Bld: 141 mg/dL — ABNORMAL HIGH (ref 65–99)
Potassium: 3.9 mmol/L (ref 3.5–5.1)
SODIUM: 141 mmol/L (ref 135–145)

## 2014-12-03 LAB — CBC
HCT: 36.5 % (ref 36.0–46.0)
HEMOGLOBIN: 11.9 g/dL — AB (ref 12.0–15.0)
MCH: 27.4 pg (ref 26.0–34.0)
MCHC: 32.6 g/dL (ref 30.0–36.0)
MCV: 83.9 fL (ref 78.0–100.0)
PLATELETS: 268 10*3/uL (ref 150–400)
RBC: 4.35 MIL/uL (ref 3.87–5.11)
RDW: 16.1 % — ABNORMAL HIGH (ref 11.5–15.5)
WBC: 10.3 10*3/uL (ref 4.0–10.5)

## 2014-12-03 LAB — GLUCOSE, CAPILLARY
GLUCOSE-CAPILLARY: 116 mg/dL — AB (ref 65–99)
GLUCOSE-CAPILLARY: 125 mg/dL — AB (ref 65–99)
GLUCOSE-CAPILLARY: 150 mg/dL — AB (ref 65–99)
Glucose-Capillary: 151 mg/dL — ABNORMAL HIGH (ref 65–99)

## 2014-12-03 MED ORDER — METOPROLOL TARTRATE 25 MG/10 ML ORAL SUSPENSION
25.0000 mg | Freq: Once | ORAL | Status: AC
  Administered 2014-12-03: 25 mg via ORAL
  Filled 2014-12-03: qty 10

## 2014-12-03 NOTE — Progress Notes (Signed)
Pt prepared for d/c to SNF. IV d/c'd. Skin intact except as charted in most recent assessments. Vitals are stable. Attempted to call report to receiving facility x 3. Obtained number from CSW and called the number three times with no answer. CSW and charge nurse made aware. Pt to be transported by ambulance service.

## 2014-12-03 NOTE — Progress Notes (Signed)
[facility] PROGRESS NOTE    Progress Note   Katie BravoJuanita House WUJ:[id number] DOB: 12/27/1940 DOA: 11/28/2014 PCP: Angela Coxasanayaka, Gayani Y, MD   Brief Narrative:   Katie BravoJuanita Shorts is an 74 y.o. female   Assessment/Plan:   Principal Problem:   Acute encephalopathy Active Problems:   Dementia   Diabetes mellitus (HCC)   Hyperlipemia   Depression   Essential hypertension, benign   Late effects of cerebrovascular disease   Diabetes type 2, controlled (HCC)   Change in mental status   Altered mental status  Patient was awaiting a speech evaluation. Evaluated by speech the recommended a dysphagia 1 diet. She is medically stable to be transferred to facility.    DVT Prophylaxis - Lovenox ordered.  Family Communication: none Disposition Plan: Home when stable. Code Status:     Code Status Orders        Start     Ordered   11/29/14 0152  Full code   Continuous     11/29/14 0151        IV Access:    Peripheral IV   Procedures and diagnostic studies:   No results found.   Medical Consultants:    None.  Anti-Infectives:   Anti-infectives    None      Subjective:    Delaine Slight nonverbal  Objective:    Filed Vitals:   12/02/14 2125 12/03/14 0010 12/03/14 0554 12/03/14 0917  BP: 177/109 186/70 177/76 180/68  Pulse: 60 118 59   Temp: 97.8 F (36.6 C)  98.3 F (36.8 C)   TempSrc: Oral  Oral   Resp: 16  16   Height:      Weight:      SpO2: 100%  100%     Intake/Output Summary (Last 24 hours) at 12/03/14 1112 Last data filed at 12/03/14 0846  Gross per 24 hour  Intake    100 ml  Output      0 ml  Net    100 ml   Filed Weights   11/29/14 0126  Weight: 54.7 kg (120 lb 9.5 oz)    Exam: Gen:  NAD Cardiovascular:  RRR. Chest and lungs:   CTAB Abdomen:  Abdomen soft, NT/ND, + BS Extremities:  No C/E/C   Data Reviewed:    Labs: Basic Metabolic Panel:  Recent Labs Lab 11/29/14 0558 11/30/14 0543 12/01/14 0550  12/02/14 0537 12/03/14 0542  NA 136 133* 137 139 141  K 4.7 4.2 3.9 4.0 3.9  CL 104 103 106 108 108  CO2 23 20* 18* 20* 17*  GLUCOSE 162* 128* 97 112* 141*  BUN 29* 20 12 10 14   CREATININE 1.29* 1.18* 1.13* 1.11* 1.30*  CALCIUM 9.2 9.0 9.1 9.6 9.9  MG 2.0  --   --   --   --   PHOS 3.8  --   --   --   --    GFR Estimated Creatinine Clearance: 32.8 mL/min (by C-G formula based on Cr of 1.3). Liver Function Tests:  Recent Labs Lab 11/28/14 2043 11/29/14 0558  AST 20 20  ALT 28 25  ALKPHOS 85 81  BILITOT 0.7 0.9  PROT 7.4 6.8  ALBUMIN 3.8 3.4*   No results for input(s): LIPASE, AMYLASE in the last 168 hours.  Recent Labs Lab 11/29/14 0558  AMMONIA 20   Coagulation profile No results for input(s): INR, PROTIME in the last 168 hours.  CBC:  Recent Labs Lab 11/29/14 0558 11/30/14 0543 12/01/14 0550 12/02/14 [id number]  12/03/14 0542  WBC 8.6 7.8 7.9 8.7 10.3  HGB 11.0* 9.7* 10.3* 11.4* 11.9*  HCT 34.4* 30.2* 32.4* 35.5* 36.5  MCV 86.4 85.3 84.4 84.1 83.9  PLT 240 219 236 241 268   Cardiac Enzymes: No results for input(s): CKTOTAL, CKMB, CKMBINDEX, TROPONINI in the last 168 hours. BNP (last 3 results) No results for input(s): PROBNP in the last 8760 hours. CBG:  Recent Labs Lab 12/02/14 1617 12/02/14 2005 12/03/14 0006 12/03/14 0414 12/03/14 0742  GLUCAP 122* 132* 125* 150* 151*   D-Dimer: No results for input(s): DDIMER in the last 72 hours. Hgb A1c: No results for input(s): HGBA1C in the last 72 hours. Lipid Profile: No results for input(s): CHOL, HDL, LDLCALC, TRIG, CHOLHDL, LDLDIRECT in the last 72 hours. Thyroid function studies: No results for input(s): TSH, T4TOTAL, T3FREE, THYROIDAB in the last 72 hours.  Invalid input(s): FREET3 Anemia work up: No results for input(s): VITAMINB12, FOLATE, FERRITIN, TIBC, IRON, RETICCTPCT in the last 72 hours. Sepsis Labs:  Recent Labs Lab 11/28/14 2045  11/30/14 0543 12/01/14 0550 12/02/14 0537  12/03/14 0542  WBC  --   < > 7.8 7.9 8.7 10.3  LATICACIDVEN 1.54  --   --   --   --   --   < > = values in this interval not displayed. Microbiology Recent Results (from the past 240 hour(s))  MRSA PCR Screening     Status: None   Collection Time: 11/29/14  2:08 AM  Result Value Ref Range Status   MRSA by PCR NEGATIVE NEGATIVE Final    Comment:        The GeneXpert MRSA Assay (FDA approved for NASAL specimens only), is one component of a comprehensive MRSA colonization surveillance program. It is not intended to diagnose MRSA infection nor to guide or monitor treatment for MRSA infections.      Medications:   . amLODipine  10 mg Oral Daily  . enoxaparin (LOVENOX) injection  30 mg Subcutaneous Q24H  . feeding supplement (GLUCERNA SHAKE)  237 mL Oral BID BM  . hydrALAZINE  10 mg Oral TID  . insulin aspart  0-9 Units Subcutaneous 6 times per day  . lamoTRIgine  150 mg Oral Daily  . levETIRAcetam  750 mg Oral BID  . mirtazapine  15 mg Oral QHS  . polyvinyl alcohol  1 drop Both Eyes BID  . sodium chloride  3 mL Intravenous Q12H  . tobramycin   Left Eye QID   Continuous Infusions:   Time spent: 15 min   LOS: 3 days   Marinda Elk  [facility] Pager [phone]  *Please refer to [email or web address], password [email or web address] to get updated schedule on who will round on this patient, as hospitalists switch teams weekly. If 7PM-7AM, please contact night-coverage at [email or web address], password [email or web address] for any overnight needs.  12/03/2014, 11:12 AM

## 2014-12-04 ENCOUNTER — Encounter: Payer: Self-pay | Admitting: Internal Medicine

## 2014-12-05 ENCOUNTER — Non-Acute Institutional Stay (SKILLED_NURSING_FACILITY): Payer: Medicare Other | Admitting: Internal Medicine

## 2014-12-05 DIAGNOSIS — R627 Adult failure to thrive: Secondary | ICD-10-CM

## 2014-12-05 DIAGNOSIS — R5383 Other fatigue: Secondary | ICD-10-CM | POA: Diagnosis not present

## 2014-12-05 DIAGNOSIS — F0280 Dementia in other diseases classified elsewhere without behavioral disturbance: Secondary | ICD-10-CM

## 2014-12-05 DIAGNOSIS — G309 Alzheimer's disease, unspecified: Secondary | ICD-10-CM | POA: Diagnosis not present

## 2014-12-05 DIAGNOSIS — I499 Cardiac arrhythmia, unspecified: Secondary | ICD-10-CM | POA: Diagnosis not present

## 2014-12-05 NOTE — Progress Notes (Signed)
Patient ID: Katie House, female   DOB: 02/24/1940, 74 y.o.   MRN: [id number]          This is an acute visit.  Level of care skilled.  Facility AF.   Chief complaint- Acute visit status post hospitalization for altered mental status-also noted significant tachycardia    History of present illness.  Patient is a [age] year old female - she has a history of severe dementia with history of CVA with right hemiparesis global aphasia visual deficits and dysphagia  When I saw her several days ago she had limited responsiveness this had been a fairly acute change per nursing and she was sent to the hospital for evaluation-.  In hospital CT of the head did not show any acute abnormality laboratory work was relatively baseline.  EEG did not show evidence of seizures.  Neurology was consulted-they reviewed [facility] from [facility] and left carotid and left MCA stents were placed.  Per neurology MRI of the brain would not change the patient management and decision was made not to pursue an MRI.  Neurology recommended continuing Keppra and Lamictal Keppra was increased to 750 mg twice a day.  She has returned to the facility apparently she is not been eating and drinking much at all since her arrival this weekend-when I evaluated her today her heart rate was between 130 and 140-otherwise vital signs appear to be stable she did have a low-grade temperature of 99.0 axillary.  .  She is not responding much today CBG is stable as well as O2 saturation is in the 90s  r                  Family medical social history has been reviewed per previous progress notes as well as discharge note on 12/03/2014   [facility] This includes.  Cymbalta 60 mg daily.  Norvasc 10 mg daily.  Remeron 15 mg daily.  MiraLAX daily.  Levimer   15 units daily at bedtime.--This is currently on hold  NovoLog sliding scale insulin  with meals.  Keppra 750 mg twice a day.  Hydralazine 10 mg every 8 hours  Lamictal 150 mg daily.  Claritin 10 mg daily.  Tramadol 50 mg daily.  TobraDex eye drops into left eye 4 times a day for 5 days     Review of systems essentially unobtainable patient is a phasic.  N Nursing staff's family states she has not really eaten and drinking much of anything over the weekend r    .    Physical exam.  T-. 99.0 axillary pulse 1:30-140--respirations 18 blood pressure 156/85 O2 saturation is 98% CBG is unremarkable.   In general this is an elderly female in no distress but does appear to be lethargic  baseline nonverbal  Her skin is warm and dry--I do not see any rashes or evidence of cellulitis Mouth-- she does appear to have a mouth droop.  Oropharynx was difficult to assess patient did not really open her mouth very wide.  Eyes she does appear to have bilateral cataracts but limited vision   Chest is clear to auscultation with poor respiratory effort slightly labored breathing.  Heart is regular rate and rhythm but significantly elevated at between 130-140 beats a minute there is no significant lower extremity edema  a.  Abdomen is soft does not appear to be acutely tender-  with positive bowel sounds--it is somewhat obese  GU cannot really  appreciate any suprapubic tenderness or discharge  .  Muscle skeletal does have right-sided weakness with contracture of her right upper extremity wrist and hand this is baseline.--Moves her left upper extremity   Neurologic again right-sided deficits as noted above she is a phasic- She appears very weak with limited responsiveness-she is somewhat staring off into space       Labs  12/02/2014.  Sodium 139 potassium 4.0 BUN 10 creatinine 1.11 CO2 level liver function tests within normal limits except albumin of 3.4.  WBC 8.7 hemoglobin 11.5 platelets 241.  Ammonia level on October 18 with 20    11/16/2014.  The VBAC 17.6  hemoglobin 15.2 platelets 36.  Liver function tests albumin 3.8 ALT 12 otherwise liver function tests within normal limits.  Sodium 135 potassium 4.6 BUN 49 creatinine 1.0.       Assessment and plan.   #1-patient with significant tachycardia-limited responsiveness-significant failure to thrive-I did speak with her daughter who is in the room-they would like patient transferred to [facility] if at all possible-they're quite familiar apparently with patient there-will send her to the hospital hopefully this can be arranged.  Patient does have significant tachycardia otherwise vital signs appear stable but she is not doing very well at this point I suspect with her significant dementia this will continue to be a challenge-again family would like this to be evaluated on an urgent basis.  I did monitor patient before EMS arrived she appeared to be unchanged-- pulse rate did go down a bit- Again will await Hospital evaluation I suspect she will be admitted.  [id number]  .     Marland Kitchen        --     .               Marland Kitchen Marland Kitchen

## 2014-12-06 ENCOUNTER — Encounter: Payer: Self-pay | Admitting: Internal Medicine

## 2014-12-06 NOTE — Progress Notes (Signed)
MRN: [id number] Name: Katie House  Sex: female Age: 74 y.o. DOB: 07/05/1940  PSC #:  Facility/Room: Level Of Care: SNF Provider: Merrilee SeashoreALEXANDER, Anique Beckley D Emergency Contacts: Extended Emergency Contact Information Primary Emergency Contact: Jeanann LewandowskyLane,Audrey  [place] Mobile Phone: [phone] Relation: Daughter Secondary Emergency Contact: Twist,Vickie  [place] Home Phone: [phone] Relation: None  Code Status:   Allergies: Latex  Chief Complaint  Patient presents with  . Readmit To SNF    HPI: Patient is 74 y.o. female who  Past Medical History  Diagnosis Date  . Dementia   . Diabetes mellitus (HCC)   . High blood pressure   . Glaucoma   . Cataracts, both eyes   . Hyperlipemia   . Depression   . Constipation   . Anemia   . Stroke (HCC)   . Dysphagia, unspecified(787.20)   . Dementia, unspecified, without behavioral disturbance     History reviewed. No pertinent past surgical history.    Medication List       This list is accurate as of: 12/06/14  4:56 PM.  Always use your most recent med list.               amLODipine 10 MG tablet  Commonly known as:  NORVASC  Take 10 mg by mouth daily.     DULoxetine 60 MG capsule  Commonly known as:  CYMBALTA  Take 60 mg by mouth daily.     feeding supplement (GLUCERNA SHAKE) Liqd  Take 237 mLs by mouth 2 (two) times daily between meals.     hydrALAZINE 10 MG tablet  Commonly known as:  APRESOLINE  Take 10 mg by mouth 3 (three) times daily.     insulin aspart 100 UNIT/ML injection  Commonly known as:  novoLOG  Inject 2-8 Units into the skin 3 (three) times daily before meals. 61-200=0 units, 201-250=2 units, 251-300=4 units, 301-350=6 units, 351-400=8 units if above 400 give 8 units and notify provider.     lamoTRIgine 150 MG tablet  Commonly known as:  LAMICTAL  Take 150 mg by mouth daily. Notify provider immediately if patient develops rash     levETIRAcetam 750 MG tablet   Commonly known as:  KEPPRA  Take 1 tablet (750 mg total) by mouth 2 (two) times daily.     loratadine 10 MG tablet  Commonly known as:  CLARITIN  Take 10 mg by mouth daily. For allergies     methylcellulose 1 % ophthalmic solution  Commonly known as:  ARTIFICIAL TEARS  Place 1 drop into both eyes 2 (two) times daily. For dry eyes     MIRALAX powder  Generic drug:  polyethylene glycol powder  Take 17 g by mouth daily. For constipation     mirtazapine 15 MG tablet  Commonly known as:  REMERON  Take 15 mg by mouth at bedtime.     saccharomyces boulardii 250 MG capsule  Commonly known as:  FLORASTOR  Take 250 mg by mouth 2 (two) times daily. Take for 10 days start date on 11-08-14     tobramycin 0.3 % ophthalmic ointment  Commonly known as:  TOBREX  Place into the left eye 4 (four) times daily. X 5days     traMADol 50 MG tablet  Commonly known as:  ULTRAM  Take 50 mg by mouth daily.        No orders of the defined types were placed in this encounter.    Immunization History  Administered Date(s) Administered  .  Influenza Whole 11/25/2012  . PPD Test 02/08/2011    Social History  Substance Use Topics  . Smoking status: Never Smoker   . Smokeless tobacco: Not on file  . Alcohol Use: No    Family history is    Review of Systems  DATA OBTAINED: from patient, nurse, medical record, family member GENERAL:  no fevers, fatigue, appetite changes SKIN: No itching, rash or wounds EYES: No eye pain, redness, discharge EARS: No earache, tinnitus, change in hearing NOSE: No congestion, drainage or bleeding  MOUTH/THROAT: No mouth or tooth pain, No sore throat RESPIRATORY: No cough, wheezing, SOB CARDIAC: No chest pain, palpitations, lower extremity edema  GI: No abdominal pain, No N/V/D or constipation, No heartburn or reflux  GU: No dysuria, frequency or urgency, or incontinence  MUSCULOSKELETAL: No unrelieved bone/joint pain NEUROLOGIC: No headache, dizziness or  focal weakness PSYCHIATRIC: No c/o anxiety or sadness   Filed Vitals:   12/06/14 1655  BP: 162/84  Pulse: 117  Temp: 99.7 F (37.6 C)  Resp: 24    SpO2 Readings from Last 1 Encounters:  12/03/14 100%        Physical Exam  GENERAL APPEARANCE: Alert, conversant,  No acute distress.  SKIN: No diaphoresis rash HEAD: Normocephalic, atraumatic  EYES: Conjunctiva/lids clear. Pupils round, reactive. EOMs intact.  EARS: External exam WNL, canals clear. Hearing grossly normal.  NOSE: No deformity or discharge.  MOUTH/THROAT: Lips w/o lesions  RESPIRATORY: Breathing is even, unlabored. Lung sounds are clear   CARDIOVASCULAR: Heart RRR no murmurs, rubs or gallops. No peripheral edema.   GASTROINTESTINAL: Abdomen is soft, non-tender, not distended w/ normal bowel sounds. GENITOURINARY: Bladder non tender, not distended  MUSCULOSKELETAL: No abnormal joints or musculature NEUROLOGIC:  Cranial nerves 2-12 grossly intact. Moves all extremities  PSYCHIATRIC: Mood and affect appropriate to situation, no behavioral issues  Patient Active Problem List   Diagnosis Date Noted  . Altered mental status   . Change in mental status 11/29/2014  . Acute encephalopathy 11/29/2014  . Leukocytosis 11/17/2014  . Pneumonia 11/17/2014  . Diabetes type 2, controlled (HCC) 06/23/2014  . Conjunctivitis 03/13/2014  . Abdominal pain 02/17/2014  . UTI (urinary tract infection) 08/29/2013  . Arrhythmia 08/29/2013  . VP (ventriculoperitoneal) shunt status 08/27/2013  . Abdominal pain, unspecified site 08/19/2013  . Pre-ulcerative calluses 03/23/2013  . Chronic kidney disease 11/27/2012  . 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 Unspecified disorder of kidney and ureter 11/03/2012  . Acute renal failure (HCC) 09/17/2012  . Seizures (HCC) 08/26/2012  . Anemia of other chronic disease 08/11/2012  . Essential hypertension, benign 06/24/2012  . Alzheimer's disease 06/24/2012  . Late  effects of cerebrovascular disease 06/24/2012  . Unspecified constipation 06/24/2012  . Dementia   . Diabetes mellitus (HCC)   . High blood pressure   . Glaucoma   . Cataracts, both eyes   . Hyperlipemia   . Depression   . Constipation   . Anemia   . Stroke (HCC)   . Dysphagia, unspecified(787.20)   . Dementia without behavioral disturbance     CBC    Component Value Date/Time   WBC 10.3 12/03/2014 0542   WBC 9.9 04/05/2014   RBC 4.35 12/03/2014 0542   HGB 11.9* 12/03/2014 0542   HCT 36.5 12/03/2014 0542   PLT 268 12/03/2014 0542   MCV 83.9 12/03/2014 0542    CMP     Component Value Date/Time   NA 141 12/03/2014 0542   NA 135* 06/08/2014  K 3.9 12/03/2014 0542   CL 108 12/03/2014 0542   CO2 17* 12/03/2014 0542   GLUCOSE 141* 12/03/2014 0542   BUN 14 12/03/2014 0542   BUN 26* 06/08/2014   CREATININE 1.30* 12/03/2014 0542   CREATININE 1.2* 06/08/2014   CALCIUM 9.9 12/03/2014 0542   PROT 6.8 11/29/2014 0558   ALBUMIN 3.4* 11/29/2014 0558   AST 20 11/29/2014 0558   ALT 25 11/29/2014 0558   ALKPHOS 81 11/29/2014 0558   BILITOT 0.9 11/29/2014 0558   GFRNONAA 39* 12/03/2014 0542   GFRAA 46* 12/03/2014 0542    Lab Results  Component Value Date   HGBA1C 8.8* 11/29/2014     Ct Head Wo Contrast  11/28/2014  CLINICAL DATA:  74 year old female with a history of altered mental status, increased lethargy. Prior stroke. EXAM: CT HEAD WITHOUT CONTRAST TECHNIQUE: Contiguous axial images were obtained from the base of the skull through the vertex without intravenous contrast. COMPARISON:  CT 03/24/2014 FINDINGS: Unremarkable appearance of the calvarium without acute fracture or aggressive lesion. Unremarkable appearance of the scalp soft tissues. Unremarkable appearance of the bilateral orbits. Persistent dislocation of the left mandibular condyles. Mastoid air cells are clear. No significant paranasal sinus disease Re- demonstration of extensive encephalomalacia of the left  hemisphere. Ex vacuo dilation of the left ventricular system. Intracranial stent of the proximal left MCA. No acute intracranial hemorrhage. No midline shift. No mass effect. Gray-white differentiation of the posterior fossa and the right cerebral hemisphere relatively maintained. Hypodensity in the right-sided periventricular white matter. IMPRESSION: No CT evidence of acute intracranial abnormality. Re- demonstration of extensive encephalomalacia of the left hemisphere with stent in the proximal left MCA. Signed, Yvone Neu. Loreta Ave, DO [facility] Radiology Electronically Signed   By: Gilmer Mor D.O.   On: 11/28/2014 21:37   Dg Chest Portable 1 View  11/28/2014  CLINICAL DATA:  Altered mental status, diabetes mellitus, hypertension, prior stroke EXAM: PORTABLE CHEST 1 VIEW COMPARISON:  Portable exam 2023 hours without priors for comparison. FINDINGS: Patient's RIGHT arm is contracted and obscures the RIGHT lung base. Patient's chin obscures the RIGHT apex. Normal heart size, mediastinal contours and pulmonary vascularity. Visualized lungs clear. No gross pleural effusion or pneumothorax. IMPRESSION: No definite acute abnormalities within limitations of exam. Electronically Signed   By: Ulyses Southward M.D.   On: 11/28/2014 20:33    Not all labs, radiology exams or other studies done during hospitalization come through on my EPIC note; however they are reviewed by me.    Assessment and Plan  No problem-specific assessment & plan notes found for this encounter.   Margit Hanks, MD    This encounter was created in error - please disregard.

## 2014-12-11 ENCOUNTER — Encounter: Payer: Self-pay | Admitting: Internal Medicine

## 2014-12-11 DIAGNOSIS — I471 Supraventricular tachycardia: Secondary | ICD-10-CM | POA: Insufficient documentation

## 2015-01-12 DEATH — deceased

## 2017-08-26 IMAGING — CR DG CHEST 1V PORT
1 series · 1 of 1 positions shown · non-contrast
Comparison: Portable exam 8089 hours without priors for comparison.

CLINICAL DATA: Altered mental status, diabetes mellitus,
hypertension, prior stroke

EXAM:
PORTABLE CHEST 1 VIEW

[AP]
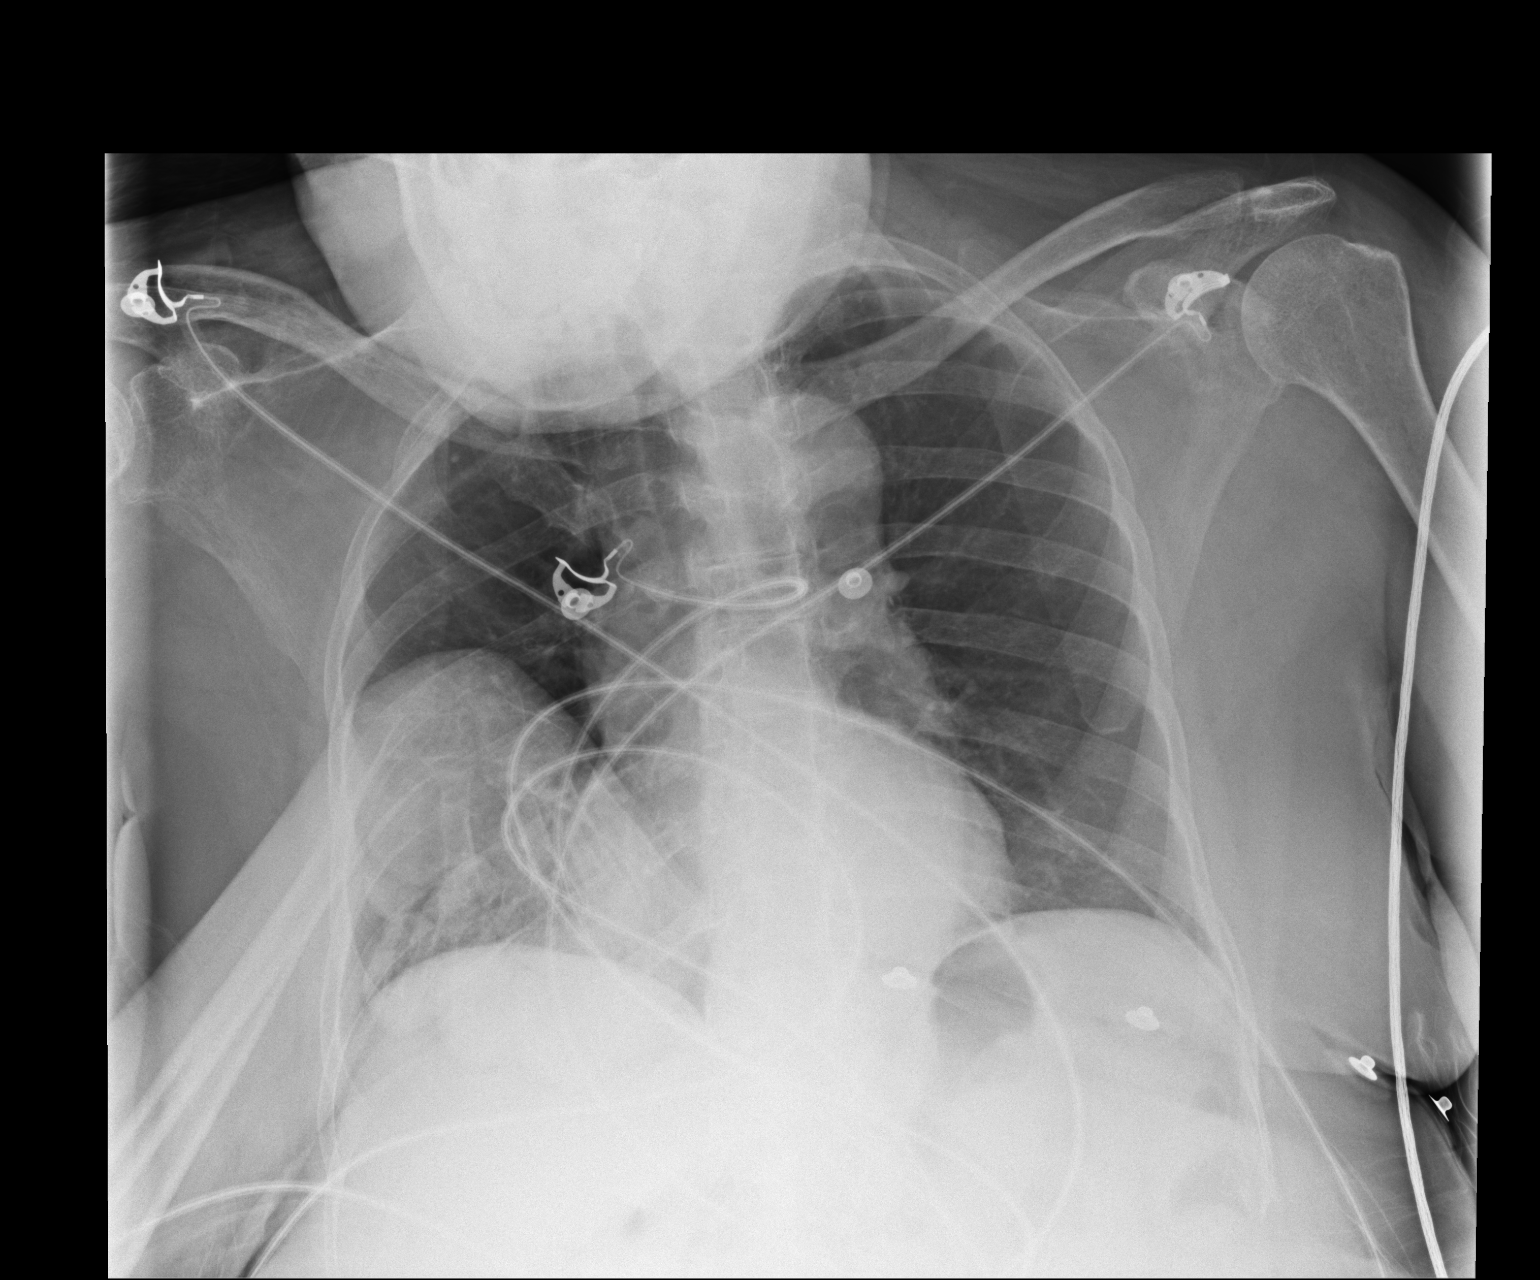

[1 of 1 positions shown; findings below may reference images not displayed]

FINDINGS: Patient's RIGHT arm is contracted and obscures the RIGHT lung base.

Patient's chin obscures the RIGHT apex.

Normal heart size, mediastinal contours and pulmonary vascularity.

Visualized lungs clear.

No gross pleural effusion or pneumothorax.
IMPRESSION: No definite acute abnormalities within limitations of exam.

## 2018-02-06 NOTE — Progress Notes (Signed)
This encounter was created in error - please disregard.
